# Patient Record
Sex: Female | Born: 1960 | Race: White | Hispanic: No | Marital: Married | Smoking: Current every day smoker
Health system: Southern US, Community
[De-identification: ages and names within clinical notes are randomized; demographics above are authoritative.]

## PROBLEM LIST (undated history)

## (undated) DIAGNOSIS — M1990 Unspecified osteoarthritis, unspecified site: Secondary | ICD-10-CM

## (undated) DIAGNOSIS — E079 Disorder of thyroid, unspecified: Secondary | ICD-10-CM

## (undated) DIAGNOSIS — E0590 Thyrotoxicosis, unspecified without thyrotoxic crisis or storm: Secondary | ICD-10-CM

## (undated) DIAGNOSIS — I700 Atherosclerosis of aorta: Secondary | ICD-10-CM

## (undated) DIAGNOSIS — M797 Fibromyalgia: Secondary | ICD-10-CM

## (undated) DIAGNOSIS — K219 Gastro-esophageal reflux disease without esophagitis: Secondary | ICD-10-CM

## (undated) DIAGNOSIS — I7300 Raynaud's syndrome without gangrene: Secondary | ICD-10-CM

## (undated) HISTORY — PX: DILATION AND CURETTAGE OF UTERUS: SHX78

## (undated) HISTORY — DX: Atherosclerosis of aorta: I70.0

## (undated) HISTORY — DX: Gastro-esophageal reflux disease without esophagitis: K21.9

## (undated) HISTORY — PX: BREAST SURGERY: SHX581

## (undated) HISTORY — DX: Raynaud's syndrome without gangrene: I73.00

---

## 1997-04-21 ENCOUNTER — Other Ambulatory Visit: Admission: RE | Admit: 1997-04-21 | Discharge: 1997-04-21 | Payer: Self-pay | Admitting: *Deleted

## 1999-02-01 ENCOUNTER — Other Ambulatory Visit: Admission: RE | Admit: 1999-02-01 | Discharge: 1999-02-01 | Payer: Self-pay | Admitting: Gynecology

## 1999-12-10 ENCOUNTER — Ambulatory Visit (HOSPITAL_COMMUNITY): Admission: RE | Admit: 1999-12-10 | Discharge: 1999-12-10 | Payer: Self-pay | Admitting: Gastroenterology

## 2002-10-28 ENCOUNTER — Other Ambulatory Visit: Admission: RE | Admit: 2002-10-28 | Discharge: 2002-10-28 | Payer: Self-pay | Admitting: Gynecology

## 2004-04-05 ENCOUNTER — Other Ambulatory Visit: Admission: RE | Admit: 2004-04-05 | Discharge: 2004-04-05 | Payer: Self-pay | Admitting: Gynecology

## 2005-05-09 ENCOUNTER — Other Ambulatory Visit: Admission: RE | Admit: 2005-05-09 | Discharge: 2005-05-09 | Payer: Self-pay | Admitting: Gynecology

## 2007-04-02 ENCOUNTER — Other Ambulatory Visit: Admission: RE | Admit: 2007-04-02 | Discharge: 2007-04-02 | Payer: Self-pay | Admitting: Gynecology

## 2007-09-09 ENCOUNTER — Emergency Department (HOSPITAL_COMMUNITY): Admission: EM | Admit: 2007-09-09 | Discharge: 2007-09-09 | Payer: Self-pay | Admitting: Emergency Medicine

## 2014-01-25 ENCOUNTER — Encounter (HOSPITAL_BASED_OUTPATIENT_CLINIC_OR_DEPARTMENT_OTHER): Payer: Self-pay | Admitting: Emergency Medicine

## 2014-01-25 ENCOUNTER — Emergency Department (HOSPITAL_BASED_OUTPATIENT_CLINIC_OR_DEPARTMENT_OTHER)
Admission: EM | Admit: 2014-01-25 | Discharge: 2014-01-25 | Disposition: A | Discharge status: Home / Self Care | Payer: No Typology Code available for payment source | Attending: Emergency Medicine | Admitting: Emergency Medicine

## 2014-01-25 ENCOUNTER — Emergency Department (HOSPITAL_BASED_OUTPATIENT_CLINIC_OR_DEPARTMENT_OTHER): Payer: No Typology Code available for payment source

## 2014-01-25 DIAGNOSIS — R1013 Epigastric pain: Secondary | ICD-10-CM | POA: Diagnosis not present

## 2014-01-25 DIAGNOSIS — R1011 Right upper quadrant pain: Secondary | ICD-10-CM | POA: Diagnosis not present

## 2014-01-25 DIAGNOSIS — Z8639 Personal history of other endocrine, nutritional and metabolic disease: Secondary | ICD-10-CM | POA: Diagnosis not present

## 2014-01-25 DIAGNOSIS — Z720 Tobacco use: Secondary | ICD-10-CM | POA: Insufficient documentation

## 2014-01-25 DIAGNOSIS — Z880 Allergy status to penicillin: Secondary | ICD-10-CM | POA: Diagnosis not present

## 2014-01-25 DIAGNOSIS — M7989 Other specified soft tissue disorders: Secondary | ICD-10-CM | POA: Diagnosis not present

## 2014-01-25 DIAGNOSIS — R079 Chest pain, unspecified: Secondary | ICD-10-CM | POA: Diagnosis present

## 2014-01-25 HISTORY — DX: Disorder of thyroid, unspecified: E07.9

## 2014-01-25 HISTORY — DX: Thyrotoxicosis, unspecified without thyrotoxic crisis or storm: E05.90

## 2014-01-25 HISTORY — DX: Fibromyalgia: M79.7

## 2014-01-25 HISTORY — DX: Unspecified osteoarthritis, unspecified site: M19.90

## 2014-01-25 LAB — CBC WITH DIFFERENTIAL/PLATELET
Basophils Absolute: 0.1 10*3/uL (ref 0.0–0.1)
Basophils Relative: 1 % (ref 0–1)
Eosinophils Absolute: 0.2 10*3/uL (ref 0.0–0.7)
Eosinophils Relative: 2 % (ref 0–5)
HCT: 40.4 % (ref 36.0–46.0)
Hemoglobin: 13.9 g/dL (ref 12.0–15.0)
LYMPHS ABS: 3.3 10*3/uL (ref 0.7–4.0)
LYMPHS PCT: 32 % (ref 12–46)
MCH: 31.4 pg (ref 26.0–34.0)
MCHC: 34.4 g/dL (ref 30.0–36.0)
MCV: 91.2 fL (ref 78.0–100.0)
Monocytes Absolute: 0.8 10*3/uL (ref 0.1–1.0)
Monocytes Relative: 7 % (ref 3–12)
NEUTROS PCT: 58 % (ref 43–77)
Neutro Abs: 5.8 10*3/uL (ref 1.7–7.7)
PLATELETS: 266 10*3/uL (ref 150–400)
RBC: 4.43 MIL/uL (ref 3.87–5.11)
RDW: 12.3 % (ref 11.5–15.5)
WBC: 10.1 10*3/uL (ref 4.0–10.5)

## 2014-01-25 LAB — COMPREHENSIVE METABOLIC PANEL
ALT: 35 U/L (ref 0–35)
AST: 28 U/L (ref 0–37)
Albumin: 4.5 g/dL (ref 3.5–5.2)
Alkaline Phosphatase: 103 U/L (ref 39–117)
Anion gap: 8 (ref 5–15)
BUN: 14 mg/dL (ref 6–23)
CALCIUM: 9.7 mg/dL (ref 8.4–10.5)
CO2: 28 mmol/L (ref 19–32)
CREATININE: 0.64 mg/dL (ref 0.50–1.10)
Chloride: 105 mEq/L (ref 96–112)
GLUCOSE: 95 mg/dL (ref 70–99)
Potassium: 4.6 mmol/L (ref 3.5–5.1)
Sodium: 141 mmol/L (ref 135–145)
TOTAL PROTEIN: 7.6 g/dL (ref 6.0–8.3)
Total Bilirubin: 0.4 mg/dL (ref 0.3–1.2)

## 2014-01-25 LAB — TROPONIN I

## 2014-01-25 LAB — D-DIMER, QUANTITATIVE (NOT AT ARMC)

## 2014-01-25 LAB — LIPASE, BLOOD: LIPASE: 43 U/L (ref 11–59)

## 2014-01-25 NOTE — ED Notes (Addendum)
PT presents to ED with complaints of RUQ pain around to her back and up her right arm to elbow and right side of neck and right jaw.  lasting about 3-4 minutes around 8 am.  PT was taking meds for hypothyroid and stopped taking it before christmas then started back yesterday.

## 2014-01-25 NOTE — Discharge Instructions (Signed)
The cause of your pain was not identified today. Please continue current medications as prescribed. Please follow-up with your family doctor and gastroenterologist for further testing.   Abdominal Pain, Women Abdominal (stomach, pelvic, or belly) pain can be caused by many things. It is important to tell your doctor:  The location of the pain.  Does it come and go or is it present all the time?  Are there things that start the pain (eating certain foods, exercise)?  Are there other symptoms associated with the pain (fever, nausea, vomiting, diarrhea)? All of this is helpful to know when trying to find the cause of the pain. CAUSES   Stomach: virus or bacteria infection, or ulcer.  Intestine: appendicitis (inflamed appendix), regional ileitis (Crohn's disease), ulcerative colitis (inflamed colon), irritable bowel syndrome, diverticulitis (inflamed diverticulum of the colon), or cancer of the stomach or intestine.  Gallbladder disease or stones in the gallbladder.  Kidney disease, kidney stones, or infection.  Pancreas infection or cancer.  Fibromyalgia (pain disorder).  Diseases of the female organs:  Uterus: fibroid (non-cancerous) tumors or infection.  Fallopian tubes: infection or tubal pregnancy.  Ovary: cysts or tumors.  Pelvic adhesions (scar tissue).  Endometriosis (uterus lining tissue growing in the pelvis and on the pelvic organs).  Pelvic congestion syndrome (female organs filling up with blood just before the menstrual period).  Pain with the menstrual period.  Pain with ovulation (producing an egg).  Pain with an IUD (intrauterine device, birth control) in the uterus.  Cancer of the female organs.  Functional pain (pain not caused by a disease, may improve without treatment).  Psychological pain.  Depression. DIAGNOSIS  Your doctor will decide the seriousness of your pain by doing an examination.  Blood tests.  X-rays.  Ultrasound.  CT scan  (computed tomography, special type of X-ray).  MRI (magnetic resonance imaging).  Cultures, for infection.  Barium enema (dye inserted in the large intestine, to better view it with X-rays).  Colonoscopy (looking in intestine with a lighted tube).  Laparoscopy (minor surgery, looking in abdomen with a lighted tube).  Major abdominal exploratory surgery (looking in abdomen with a large incision). TREATMENT  The treatment will depend on the cause of the pain.   Many cases can be observed and treated at home.  Over-the-counter medicines recommended by your caregiver.  Prescription medicine.  Antibiotics, for infection.  Birth control pills, for painful periods or for ovulation pain.  Hormone treatment, for endometriosis.  Nerve blocking injections.  Physical therapy.  Antidepressants.  Counseling with a psychologist or psychiatrist.  Minor or major surgery. HOME CARE INSTRUCTIONS   Do not take laxatives, unless directed by your caregiver.  Take over-the-counter pain medicine only if ordered by your caregiver. Do not take aspirin because it can cause an upset stomach or bleeding.  Try a clear liquid diet (broth or water) as ordered by your caregiver. Slowly move to a bland diet, as tolerated, if the pain is related to the stomach or intestine.  Have a thermometer and take your temperature several times a day, and record it.  Bed rest and sleep, if it helps the pain.  Avoid sexual intercourse, if it causes pain.  Avoid stressful situations.  Keep your follow-up appointments and tests, as your caregiver orders.  If the pain does not go away with medicine or surgery, you may try:  Acupuncture.  Relaxation exercises (yoga, meditation).  Group therapy.  Counseling. SEEK MEDICAL CARE IF:   You notice certain foods cause stomach  pain. °· Your home care treatment is not helping your pain. °· You need stronger pain medicine. °· You want your IUD removed. °· You  feel faint or lightheaded. °· You develop nausea and vomiting. °· You develop a rash. °· You are having side effects or an allergy to your medicine. °SEEK IMMEDIATE MEDICAL CARE IF:  °· Your pain does not go away or gets worse. °· You have a fever. °· Your pain is felt only in portions of the abdomen. The right side could possibly be appendicitis. The left lower portion of the abdomen could be colitis or diverticulitis. °· You are passing blood in your stools (bright red or black tarry stools, with or without vomiting). °· You have blood in your urine. °· You develop chills, with or without a fever. °· You pass out. °MAKE SURE YOU:  °· Understand these instructions. °· Will watch your condition. °· Will get help right away if you are not doing well or get worse. °Document Released: 11/07/2006 Document Revised: 05/27/2013 Document Reviewed: 11/27/2008 °Exit[employer]® Patient Information ©2015 [employer]. This information is not intended to replace advice given to you by your health care provider. Make sure you discuss any questions you have with your health care provider. ° °

## 2014-01-25 NOTE — ED Provider Notes (Signed)
CSN: [id number]     Arrival date & time 01/25/14  1451 History  This chart was scribed for Tilden Fossa, MD by Modena Jansky, ED Scribe. This patient was seen in room MH02/MH02 and the patient's care was started at 3:45 PM   Chief Complaint  Patient presents with  . Chest Pain   The history is provided by the patient. No language interpreter was used.   HPI Comments: Jennifer Parrish is a 54 y.o. female who presents to the Emergency Department complaining of constant moderate lower chest pain that started about 7 hours ago. She reports that this morning while she was taking a nap, she had a sudden onset of chest pain for about 3-4 minutes. She reports that the pain radiated to her back, then right shoulder, right elbow, neck, and then jaw. She reports that the pain stopped after 3-4 minutes, and never came back. She describes the pain as a dull pressure-like sensation with stabbing in her back. She states that there are no modifying factors for the pain. She reports that she has some hematuria that is usual, and some recent unusual leg swelling. She states that she has a hx of smoking, and lately has been under stress. She denies any fever, vomiting, diarrhea, or dysuria.   Past Medical History  Diagnosis Date  . Thyroid disease   . Hyperthyroidism   . Fibromyalgia   . Arthritis    History reviewed. No pertinent past surgical history. No family history on file. History  Substance Use Topics  . Smoking status: Current Every Day Smoker  . Smokeless tobacco: Not on file  . Alcohol Use: Not on file   OB History    No data available     Review of Systems  Constitutional: Negative for fever.  Cardiovascular: Positive for chest pain and leg swelling.  Gastrointestinal: Negative for vomiting and diarrhea.  Genitourinary: Positive for hematuria (ongoing issue). Negative for dysuria.  All other systems reviewed and are negative.   Allergies  Ciprofloxacin; Keflex; and Penicillins  Home  Medications   Prior to Admission medications   Not on File   BP 160/85 mmHg  Pulse 74  Temp(Src) 98.1 F (36.7 C) (Oral)  Resp 18  Ht  (1.626 m)  Wt 137 lb (62.143 kg)  BMI 23.50 kg/m2  SpO2 98% Physical Exam  Constitutional: She is oriented to person, place, and time. She appears well-developed and well-nourished.  HENT:  Head: Normocephalic and atraumatic.  Cardiovascular: Normal rate and regular rhythm.   No murmur heard. Pulmonary/Chest: Effort normal and breath sounds normal. No respiratory distress.  Abdominal: Soft. There is tenderness. There is no rebound and no guarding.  Mild epigastric TTP.   Musculoskeletal: She exhibits no edema or tenderness.  Neurological: She is alert and oriented to person, place, and time.  Skin: Skin is warm and dry.  Psychiatric: She has a normal mood and affect. Her behavior is normal.  Nursing note and vitals reviewed.   ED Course  Procedures (including critical care time) DIAGNOSTIC STUDIES: Oxygen Saturation is 98% on RA, normal by my interpretation.    COORDINATION OF CARE: 3:49 PM- Pt advised of plan for treatment which includes medication and labs and pt agrees.  Labs Review Labs Reviewed  COMPREHENSIVE METABOLIC PANEL  CBC WITH DIFFERENTIAL  TROPONIN I  D-DIMER, QUANTITATIVE  LIPASE, BLOOD    Imaging Review Dg Chest 2 View  01/25/2014   CLINICAL DATA:  Right chest pain radiating to the neck  and jaw starting this morning.  EXAM: CHEST  2 VIEW  COMPARISON:  None.  FINDINGS: The heart size and mediastinal contours are within normal limits. There is no focal infiltrate, pulmonary edema, or pleural effusion. The visualized skeletal structures are unremarkable.  IMPRESSION: No active cardiopulmonary disease.   Electronically Signed   By: Sherian Rein M.D.   On: 01/25/2014 16:19     EKG Interpretation   Date/Time:  Saturday January 25 2014 15:09:41 EST Ventricular Rate:  64 PR Interval:  152 QRS Duration: 84 QT  Interval:  422 QTC Calculation: 435 R Axis:   66 Text Interpretation:  Normal sinus rhythm Normal ECG Confirmed by Lincoln Brigham [phone]) on 01/25/2014 3:54:59 PM      MDM   Final diagnoses:  Right upper quadrant pain    Patient here for evaluation of right upper quadrant/right lower chest pain. History of presentation is not consistent with ACS, dissection. Patient is low risk for PE and d-dimer is negative, do not feel advanced imaging is warranted this time. History presentation is not consistent with acute cholecystitis or renal colic. Patient's pain was improved on recheck, discussed with patient PCP follow-up as well as return precautions.   I personally performed the services described in this documentation, which was scribed in my presence. The recorded information has been reviewed and is accurate.     Tilden Fossa, MD 01/25/14 1744

## 2014-04-08 ENCOUNTER — Other Ambulatory Visit (HOSPITAL_COMMUNITY): Payer: Self-pay | Admitting: Internal Medicine

## 2014-04-08 ENCOUNTER — Ambulatory Visit (HOSPITAL_COMMUNITY)
Admission: RE | Admit: 2014-04-08 | Discharge: 2014-04-08 | Disposition: A | Discharge status: Home / Self Care | Payer: Commercial Managed Care - HMO | Source: Ambulatory Visit | Attending: Internal Medicine | Admitting: Internal Medicine

## 2014-04-08 DIAGNOSIS — M25562 Pain in left knee: Secondary | ICD-10-CM

## 2014-04-08 DIAGNOSIS — M545 Low back pain: Secondary | ICD-10-CM | POA: Insufficient documentation

## 2014-04-08 DIAGNOSIS — M47896 Other spondylosis, lumbar region: Secondary | ICD-10-CM | POA: Diagnosis not present

## 2015-02-25 ENCOUNTER — Ambulatory Visit (INDEPENDENT_AMBULATORY_CARE_PROVIDER_SITE_OTHER): Payer: BLUE CROSS/BLUE SHIELD | Admitting: Physician Assistant

## 2015-02-25 VITALS — BP 120/82 | HR 80 | Temp 97.9°F | Resp 18 | Ht 63.25 in | Wt 133.6 lb

## 2015-02-25 DIAGNOSIS — A499 Bacterial infection, unspecified: Secondary | ICD-10-CM | POA: Diagnosis not present

## 2015-02-25 DIAGNOSIS — N760 Acute vaginitis: Secondary | ICD-10-CM | POA: Diagnosis not present

## 2015-02-25 DIAGNOSIS — R319 Hematuria, unspecified: Secondary | ICD-10-CM | POA: Diagnosis not present

## 2015-02-25 DIAGNOSIS — B9689 Other specified bacterial agents as the cause of diseases classified elsewhere: Secondary | ICD-10-CM

## 2015-02-25 LAB — POCT CBC
GRANULOCYTE PERCENT: 48.6 % (ref 37–80)
HEMATOCRIT: 40.9 % (ref 37.7–47.9)
Hemoglobin: 14.1 g/dL (ref 12.2–16.2)
Lymph, poc: 4 — AB (ref 0.6–3.4)
MCH, POC: 30.6 pg (ref 27–31.2)
MCHC: 34.4 g/dL (ref 31.8–35.4)
MCV: 88.8 fL (ref 80–97)
MID (CBC): 0.6 (ref 0–0.9)
MPV: 7.1 fL (ref 0–99.8)
PLATELET COUNT, POC: 266 10*3/uL (ref 142–424)
POC Granulocyte: 4.4 (ref 2–6.9)
POC LYMPH %: 44.4 % (ref 10–50)
POC MID %: 7 %M (ref 0–12)
RBC: 4.61 M/uL (ref 4.04–5.48)
RDW, POC: 12.7 %
WBC: 9.1 10*3/uL (ref 4.6–10.2)

## 2015-02-25 LAB — POCT URINALYSIS DIP (MANUAL ENTRY)
BILIRUBIN UA: NEGATIVE
Bilirubin, UA: NEGATIVE
Glucose, UA: NEGATIVE
Nitrite, UA: NEGATIVE
PH UA: 6.5
PROTEIN UA: NEGATIVE
Urobilinogen, UA: 0.2

## 2015-02-25 LAB — POC MICROSCOPIC URINALYSIS (UMFC): Mucus: ABSENT

## 2015-02-25 LAB — POCT WET + KOH PREP
TRICH BY WET PREP: ABSENT
YEAST BY KOH: ABSENT
YEAST BY WET PREP: ABSENT

## 2015-02-25 MED ORDER — METRONIDAZOLE 0.75 % VA GEL: 1.0000 | Freq: Two times a day (BID) | VAGINAL | Status: DC

## 2015-02-25 MED ORDER — SULFAMETHOXAZOLE-TRIMETHOPRIM 800-160 MG PO TABS: 1.0000 | ORAL_TABLET | Freq: Two times a day (BID) | ORAL | Status: DC

## 2015-02-25 NOTE — Progress Notes (Signed)
02/26/2015 6:26 PM   DOB: [birth year] / MRN: [id number]  SUBJECTIVE:  Jennifer Parrish is a 55 y.o. female presenting for passing blood in her urine.  Reports that she has been passing "clots" through her urine.  This started yesterday and reports it tends to occur near the end of voiding.  Also complains of dysuria, frequency and urgency.  Denies a history of kidney stone.  Does not think the blood is coming from her vagina.  She has tried drinking large volumes of water and this has helped her symptoms greatly and she is feeling normal right now.  She has multiple drug allergies.  Does report having a sexual encounter with her husband on Saturday in which her "uterus got in the way" and she had to reach down and manually adjust her anatomy.   She is allergic to ciprofloxacin; keflex; and penicillins.   She  has a past medical history of Thyroid disease; Hyperthyroidism; Fibromyalgia; Arthritis; and Fibromyalgia.    She  reports that she has been smoking.  She does not have any smokeless tobacco history on file. She  has no sexual activity history on file. The patient  has past surgical history that includes Breast surgery.  Her family history includes Cancer in her father; Diabetes in her maternal grandfather and paternal grandmother; Heart disease in her mother.  Review of Systems  Constitutional: Negative for fever and chills.  Eyes: Negative for blurred vision.  Respiratory: Negative for cough and shortness of breath.   Cardiovascular: Negative for chest pain.  Gastrointestinal: Negative for nausea and abdominal pain.  Musculoskeletal: Negative for myalgias.  Skin: Negative for rash.  Neurological: Negative for dizziness, tingling and headaches.  Psychiatric/Behavioral: Negative for depression. The patient is not nervous/anxious.     Problem list and medications reviewed and updated by myself where necessary, and exist elsewhere in the encounter.   OBJECTIVE:  BP 120/82 mmHg  Pulse 80   Temp(Src) 97.9 F (36.6 C) (Oral)  Resp 18  Ht 5' 3.25" (1.607 m)  Wt 133 lb 9.6 oz (60.601 kg)  BMI 23.47 kg/m2  SpO2 97%  Physical Exam  Constitutional: She is oriented to person, place, and time. She appears well-nourished. No distress.  Eyes: EOM are normal. Pupils are equal, round, and reactive to light.  Cardiovascular: Normal rate.   Pulmonary/Chest: Effort normal.  Abdominal: Soft. Bowel sounds are normal. She exhibits no distension and no mass. There is no tenderness. There is no rebound and no guarding.  Genitourinary: There is no rash or tenderness on the right labia. There is no rash or tenderness on the left labia. Uterus is not tender. Cervix exhibits no motion tenderness, no discharge and no friability. Right adnexum displays no mass, no tenderness and no fullness. Left adnexum displays no mass, no tenderness and no fullness. No erythema, tenderness or bleeding in the vagina. No foreign body around the vagina. No signs of injury around the vagina. No vaginal discharge found.  Neurological: She is alert and oriented to person, place, and time. No cranial nerve deficit. Gait normal.  Skin: Skin is dry. She is not diaphoretic.  Psychiatric: She has a normal mood and affect.  Vitals reviewed.   Results for orders placed or performed in visit on 02/25/15 (from the past 72 hour(s))  POCT urinalysis dipstick     Status: Abnormal   Collection Time: 02/25/15  6:55 PM  Result Value Ref Range   Color, UA yellow yellow   Clarity, UA cloudy (  A) clear   Glucose, UA negative negative   Bilirubin, UA negative negative   Ketones, POC UA negative negative   Spec Grav, UA <=1.005    Blood, UA moderate (A) negative   pH, UA 6.5    Protein Ur, POC negative negative   Urobilinogen, UA 0.2    Nitrite, UA Negative Negative   Leukocytes, UA Trace (A) Negative  POCT Microscopic Urinalysis ([facility])     Status: Abnormal   Collection Time: 02/25/15  6:55 PM  Result Value Ref Range    WBC,UR,HPF,POC Few (A) None WBC/hpf   RBC,UR,HPF,POC Few (A) None RBC/hpf   Bacteria None None, Too numerous to count   Mucus Absent Absent   Epithelial Cells, UR Per Microscopy Few (A) None, Too numerous to count cells/hpf  COMPLETE METABOLIC PANEL WITH GFR     Status: None   Collection Time: 02/25/15  7:26 PM  Result Value Ref Range   Sodium 138 135 - 146 mmol/L   Potassium 4.0 3.5 - 5.3 mmol/L   Chloride 104 98 - 110 mmol/L   CO2 26 20 - 31 mmol/L   Glucose, Bld 87 65 - 99 mg/dL   BUN 12 7 - 25 mg/dL   Creat 0.75 0.50 - 1.05 mg/dL   Total Bilirubin 0.3 0.2 - 1.2 mg/dL   Alkaline Phosphatase 103 33 - 130 U/L   AST 23 10 - 35 U/L   ALT 25 6 - 29 U/L   Total Protein 7.6 6.1 - 8.1 g/dL   Albumin 4.8 3.6 - 5.1 g/dL   Calcium 10.0 8.6 - 10.4 mg/dL   GFR, Est African American >89 >=60 mL/min   GFR, Est Non African American 89 >=60 mL/min    Comment:   The estimated GFR is a calculation valid for adults (>=33 years old) that uses the CKD-EPI algorithm to adjust for age and sex. It is   not to be used for children, pregnant women, hospitalized patients,    patients on dialysis, or with rapidly changing kidney function. According to the NKDEP, eGFR >89 is normal, 60-89 shows mild impairment, 30-59 shows moderate impairment, 15-29 shows severe impairment and <15 is ESRD.     POCT Wet + KOH Prep     Status: Abnormal   Collection Time: 02/25/15  7:41 PM  Result Value Ref Range   Yeast by KOH Absent Present, Absent   Yeast by wet prep Absent Present, Absent   WBC by wet prep Too numerous to count  None, Few, Too numerous to count   Clue Cells Wet Prep HPF POC Few (A) None, Too numerous to count   Trich by wet prep Absent Present, Absent   Bacteria Wet Prep HPF POC Few None, Few, Too numerous to count   Epithelial Cells By [employer] Pref ([facility]) Moderate (A) None, Few, Too numerous to count   RBC,UR,HPF,POC None None RBC/hpf  POCT CBC     Status: Abnormal   Collection Time: 02/25/15  7:41  PM  Result Value Ref Range   WBC 9.1 4.6 - 10.2 K/uL   Lymph, poc 4.0 (A) 0.6 - 3.4   POC LYMPH PERCENT 44.4 10 - 50 %L   MID (cbc) 0.6 0 - 0.9   POC MID % 7.0 0 - 12 %M   POC Granulocyte 4.4 2 - 6.9   Granulocyte percent 48.6 37 - 80 %G   RBC 4.61 4.04 - 5.48 M/uL   Hemoglobin 14.1 12.2 - 16.2 g/dL   HCT,  POC 40.9 37.7 - 47.9 %   MCV 88.8 80 - 97 fL   MCH, POC 30.6 27 - 31.2 pg   MCHC 34.4 31.8 - 35.4 g/dL   RDW, POC 12.7 %   Platelet Count, POC 266 142 - 424 K/uL   MPV 7.1 0 - 99.8 fL    No results found.  ASSESSMENT AND PLAN  Deslyn was seen today for urinary tract infection.  Diagnoses and all orders for this visit:  Blood in urine: She has an interesting presentation.  Per her HPI the clots are likely secondary to mild trauma during intercourse.  CBC and UA don't support pyelo, however given her report of preceding back pain will cover for that with a secondary option given her allergies.  She does have BV although this does not explain her symptoms.  Given the relief she has had with po fluids and ibuprofen she may have passed a stone.  Will call her tomorrow to see how she is doing and will consider adding flomax if she remains symptomatic.   -     POCT urinalysis dipstick -     POCT Microscopic Urinalysis ([facility]) -     Urine culture -     POCT Wet + KOH Prep -     POCT CBC -     COMPLETE METABOLIC PANEL WITH GFR -     sulfamethoxazole-trimethoprim (BACTRIM DS,SEPTRA DS) 800-160 MG tablet; Take 1 tablet by mouth 2 (two) times daily. -     Cancel: GC probe amplification, urine -     GC/Chlamydia Probe Amp  BV (bacterial vaginosis) -     metroNIDAZOLE (METROGEL VAGINAL) 0.75 % vaginal gel; Place 1 Applicatorful vaginally 2 (two) times daily.    The patient was advised to call or return to clinic if she does not see an improvement in symptoms or to seek the care of the closest emergency department if she worsens with the above plan.   Philis Fendt, MHS, PA-C [facility] 02/26/2015 6:26 PM

## 2015-02-26 LAB — COMPLETE METABOLIC PANEL WITH GFR
ALBUMIN: 4.8 g/dL (ref 3.6–5.1)
ALK PHOS: 103 U/L (ref 33–130)
ALT: 25 U/L (ref 6–29)
AST: 23 U/L (ref 10–35)
BILIRUBIN TOTAL: 0.3 mg/dL (ref 0.2–1.2)
BUN: 12 mg/dL (ref 7–25)
CALCIUM: 10 mg/dL (ref 8.6–10.4)
CO2: 26 mmol/L (ref 20–31)
Chloride: 104 mmol/L (ref 98–110)
Creat: 0.75 mg/dL (ref 0.50–1.05)
GFR, Est African American: >89 mL/min (ref >=60)
GFR, Est Non African American: 89 mL/min (ref >=60)
Glucose, Bld: 87 mg/dL (ref 65–99)
POTASSIUM: 4 mmol/L (ref 3.5–5.3)
Sodium: 138 mmol/L (ref 135–146)
TOTAL PROTEIN: 7.6 g/dL (ref 6.1–8.1)

## 2015-02-27 LAB — GC/CHLAMYDIA PROBE AMP
CT Probe RNA: NOT DETECTED
GC Probe RNA: NOT DETECTED

## 2015-02-28 LAB — URINE CULTURE: Colony Count: >=100000

## 2015-03-11 ENCOUNTER — Encounter: Payer: Self-pay | Admitting: Physician Assistant

## 2015-05-18 DIAGNOSIS — Z1231 Encounter for screening mammogram for malignant neoplasm of breast: Secondary | ICD-10-CM | POA: Diagnosis not present

## 2015-05-18 DIAGNOSIS — Z803 Family history of malignant neoplasm of breast: Secondary | ICD-10-CM | POA: Diagnosis not present

## 2015-06-08 DIAGNOSIS — K5730 Diverticulosis of large intestine without perforation or abscess without bleeding: Secondary | ICD-10-CM | POA: Diagnosis not present

## 2015-06-08 DIAGNOSIS — Z1211 Encounter for screening for malignant neoplasm of colon: Secondary | ICD-10-CM | POA: Diagnosis not present

## 2015-06-15 DIAGNOSIS — H524 Presbyopia: Secondary | ICD-10-CM | POA: Diagnosis not present

## 2015-10-12 DIAGNOSIS — Z23 Encounter for immunization: Secondary | ICD-10-CM | POA: Diagnosis not present

## 2015-10-12 DIAGNOSIS — L658 Other specified nonscarring hair loss: Secondary | ICD-10-CM | POA: Diagnosis not present

## 2015-10-12 DIAGNOSIS — L659 Nonscarring hair loss, unspecified: Secondary | ICD-10-CM | POA: Diagnosis not present

## 2015-10-12 DIAGNOSIS — E038 Other specified hypothyroidism: Secondary | ICD-10-CM | POA: Diagnosis not present

## 2015-10-12 DIAGNOSIS — M2550 Pain in unspecified joint: Secondary | ICD-10-CM | POA: Diagnosis not present

## 2015-11-09 DIAGNOSIS — M79641 Pain in right hand: Secondary | ICD-10-CM | POA: Diagnosis not present

## 2015-11-09 DIAGNOSIS — M1811 Unilateral primary osteoarthritis of first carpometacarpal joint, right hand: Secondary | ICD-10-CM | POA: Diagnosis not present

## 2015-11-09 DIAGNOSIS — M180 Bilateral primary osteoarthritis of first carpometacarpal joints: Secondary | ICD-10-CM | POA: Diagnosis not present

## 2015-11-09 DIAGNOSIS — M1812 Unilateral primary osteoarthritis of first carpometacarpal joint, left hand: Secondary | ICD-10-CM | POA: Diagnosis not present

## 2016-05-09 DIAGNOSIS — E559 Vitamin D deficiency, unspecified: Secondary | ICD-10-CM | POA: Diagnosis not present

## 2016-05-09 DIAGNOSIS — R3129 Other microscopic hematuria: Secondary | ICD-10-CM | POA: Diagnosis not present

## 2016-05-09 DIAGNOSIS — E784 Other hyperlipidemia: Secondary | ICD-10-CM | POA: Diagnosis not present

## 2016-05-09 DIAGNOSIS — E038 Other specified hypothyroidism: Secondary | ICD-10-CM | POA: Diagnosis not present

## 2016-05-16 DIAGNOSIS — Z0000 Encounter for general adult medical examination without abnormal findings: Secondary | ICD-10-CM | POA: Diagnosis not present

## 2016-05-16 DIAGNOSIS — Z23 Encounter for immunization: Secondary | ICD-10-CM | POA: Diagnosis not present

## 2016-05-16 DIAGNOSIS — E784 Other hyperlipidemia: Secondary | ICD-10-CM | POA: Diagnosis not present

## 2016-05-16 DIAGNOSIS — R0789 Other chest pain: Secondary | ICD-10-CM | POA: Diagnosis not present

## 2016-05-16 DIAGNOSIS — R1084 Generalized abdominal pain: Secondary | ICD-10-CM | POA: Diagnosis not present

## 2016-05-16 DIAGNOSIS — Z6825 Body mass index (BMI) 25.0-25.9, adult: Secondary | ICD-10-CM | POA: Diagnosis not present

## 2016-05-16 DIAGNOSIS — I700 Atherosclerosis of aorta: Secondary | ICD-10-CM | POA: Diagnosis not present

## 2016-05-16 DIAGNOSIS — Z1389 Encounter for screening for other disorder: Secondary | ICD-10-CM | POA: Diagnosis not present

## 2016-10-10 DIAGNOSIS — Z1231 Encounter for screening mammogram for malignant neoplasm of breast: Secondary | ICD-10-CM | POA: Diagnosis not present

## 2016-11-14 DIAGNOSIS — M797 Fibromyalgia: Secondary | ICD-10-CM | POA: Diagnosis not present

## 2016-11-14 DIAGNOSIS — E038 Other specified hypothyroidism: Secondary | ICD-10-CM | POA: Diagnosis not present

## 2016-11-14 DIAGNOSIS — F3289 Other specified depressive episodes: Secondary | ICD-10-CM | POA: Diagnosis not present

## 2016-11-14 DIAGNOSIS — Z23 Encounter for immunization: Secondary | ICD-10-CM | POA: Diagnosis not present

## 2016-11-14 DIAGNOSIS — M545 Low back pain: Secondary | ICD-10-CM | POA: Diagnosis not present

## 2016-11-15 DIAGNOSIS — Z23 Encounter for immunization: Secondary | ICD-10-CM | POA: Diagnosis not present

## 2016-11-15 DIAGNOSIS — M545 Low back pain: Secondary | ICD-10-CM | POA: Diagnosis not present

## 2017-04-17 DIAGNOSIS — H524 Presbyopia: Secondary | ICD-10-CM | POA: Diagnosis not present

## 2017-04-17 DIAGNOSIS — H5203 Hypermetropia, bilateral: Secondary | ICD-10-CM | POA: Diagnosis not present

## 2017-06-20 DIAGNOSIS — E559 Vitamin D deficiency, unspecified: Secondary | ICD-10-CM | POA: Diagnosis not present

## 2017-06-20 DIAGNOSIS — R82998 Other abnormal findings in urine: Secondary | ICD-10-CM | POA: Diagnosis not present

## 2017-06-20 DIAGNOSIS — E038 Other specified hypothyroidism: Secondary | ICD-10-CM | POA: Diagnosis not present

## 2017-06-20 DIAGNOSIS — Z0000 Encounter for general adult medical examination without abnormal findings: Secondary | ICD-10-CM | POA: Diagnosis not present

## 2017-06-26 DIAGNOSIS — Z1389 Encounter for screening for other disorder: Secondary | ICD-10-CM | POA: Diagnosis not present

## 2017-06-26 DIAGNOSIS — L658 Other specified nonscarring hair loss: Secondary | ICD-10-CM | POA: Diagnosis not present

## 2017-06-26 DIAGNOSIS — I700 Atherosclerosis of aorta: Secondary | ICD-10-CM | POA: Diagnosis not present

## 2017-06-26 DIAGNOSIS — Z0000 Encounter for general adult medical examination without abnormal findings: Secondary | ICD-10-CM | POA: Diagnosis not present

## 2017-06-26 DIAGNOSIS — E7849 Other hyperlipidemia: Secondary | ICD-10-CM | POA: Diagnosis not present

## 2017-06-26 DIAGNOSIS — M545 Low back pain: Secondary | ICD-10-CM | POA: Diagnosis not present

## 2017-06-27 ENCOUNTER — Other Ambulatory Visit: Payer: Self-pay | Admitting: Internal Medicine

## 2017-06-27 DIAGNOSIS — M545 Low back pain, unspecified: Secondary | ICD-10-CM

## 2017-08-28 DIAGNOSIS — E038 Other specified hypothyroidism: Secondary | ICD-10-CM | POA: Diagnosis not present

## 2017-11-20 DIAGNOSIS — Z1231 Encounter for screening mammogram for malignant neoplasm of breast: Secondary | ICD-10-CM | POA: Diagnosis not present

## 2017-11-20 DIAGNOSIS — Z803 Family history of malignant neoplasm of breast: Secondary | ICD-10-CM | POA: Diagnosis not present

## 2018-11-26 DIAGNOSIS — M62830 Muscle spasm of back: Secondary | ICD-10-CM | POA: Diagnosis not present

## 2018-11-26 DIAGNOSIS — M9903 Segmental and somatic dysfunction of lumbar region: Secondary | ICD-10-CM | POA: Diagnosis not present

## 2018-11-26 DIAGNOSIS — M545 Low back pain: Secondary | ICD-10-CM | POA: Diagnosis not present

## 2018-11-26 DIAGNOSIS — M9901 Segmental and somatic dysfunction of cervical region: Secondary | ICD-10-CM | POA: Diagnosis not present

## 2018-11-28 DIAGNOSIS — M9901 Segmental and somatic dysfunction of cervical region: Secondary | ICD-10-CM | POA: Diagnosis not present

## 2018-11-28 DIAGNOSIS — M62830 Muscle spasm of back: Secondary | ICD-10-CM | POA: Diagnosis not present

## 2018-11-28 DIAGNOSIS — M9903 Segmental and somatic dysfunction of lumbar region: Secondary | ICD-10-CM | POA: Diagnosis not present

## 2018-11-28 DIAGNOSIS — M545 Low back pain: Secondary | ICD-10-CM | POA: Diagnosis not present

## 2018-11-30 DIAGNOSIS — M9903 Segmental and somatic dysfunction of lumbar region: Secondary | ICD-10-CM | POA: Diagnosis not present

## 2018-11-30 DIAGNOSIS — M545 Low back pain: Secondary | ICD-10-CM | POA: Diagnosis not present

## 2018-11-30 DIAGNOSIS — M9901 Segmental and somatic dysfunction of cervical region: Secondary | ICD-10-CM | POA: Diagnosis not present

## 2018-11-30 DIAGNOSIS — M62830 Muscle spasm of back: Secondary | ICD-10-CM | POA: Diagnosis not present

## 2018-12-03 DIAGNOSIS — M9903 Segmental and somatic dysfunction of lumbar region: Secondary | ICD-10-CM | POA: Diagnosis not present

## 2018-12-03 DIAGNOSIS — M545 Low back pain: Secondary | ICD-10-CM | POA: Diagnosis not present

## 2018-12-03 DIAGNOSIS — M62830 Muscle spasm of back: Secondary | ICD-10-CM | POA: Diagnosis not present

## 2018-12-03 DIAGNOSIS — M9901 Segmental and somatic dysfunction of cervical region: Secondary | ICD-10-CM | POA: Diagnosis not present

## 2018-12-05 DIAGNOSIS — E039 Hypothyroidism, unspecified: Secondary | ICD-10-CM | POA: Diagnosis not present

## 2018-12-05 DIAGNOSIS — M9901 Segmental and somatic dysfunction of cervical region: Secondary | ICD-10-CM | POA: Diagnosis not present

## 2018-12-05 DIAGNOSIS — M9903 Segmental and somatic dysfunction of lumbar region: Secondary | ICD-10-CM | POA: Diagnosis not present

## 2018-12-05 DIAGNOSIS — M545 Low back pain: Secondary | ICD-10-CM | POA: Diagnosis not present

## 2018-12-05 DIAGNOSIS — F17210 Nicotine dependence, cigarettes, uncomplicated: Secondary | ICD-10-CM | POA: Diagnosis not present

## 2018-12-05 DIAGNOSIS — R0789 Other chest pain: Secondary | ICD-10-CM | POA: Diagnosis not present

## 2018-12-05 DIAGNOSIS — M62830 Muscle spasm of back: Secondary | ICD-10-CM | POA: Diagnosis not present

## 2018-12-07 DIAGNOSIS — M62830 Muscle spasm of back: Secondary | ICD-10-CM | POA: Diagnosis not present

## 2018-12-07 DIAGNOSIS — M9901 Segmental and somatic dysfunction of cervical region: Secondary | ICD-10-CM | POA: Diagnosis not present

## 2018-12-07 DIAGNOSIS — M545 Low back pain: Secondary | ICD-10-CM | POA: Diagnosis not present

## 2018-12-07 DIAGNOSIS — M9903 Segmental and somatic dysfunction of lumbar region: Secondary | ICD-10-CM | POA: Diagnosis not present

## 2018-12-10 DIAGNOSIS — M9901 Segmental and somatic dysfunction of cervical region: Secondary | ICD-10-CM | POA: Diagnosis not present

## 2018-12-10 DIAGNOSIS — M62830 Muscle spasm of back: Secondary | ICD-10-CM | POA: Diagnosis not present

## 2018-12-10 DIAGNOSIS — M9903 Segmental and somatic dysfunction of lumbar region: Secondary | ICD-10-CM | POA: Diagnosis not present

## 2018-12-10 DIAGNOSIS — M545 Low back pain: Secondary | ICD-10-CM | POA: Diagnosis not present

## 2018-12-12 DIAGNOSIS — M545 Low back pain: Secondary | ICD-10-CM | POA: Diagnosis not present

## 2018-12-12 DIAGNOSIS — M9901 Segmental and somatic dysfunction of cervical region: Secondary | ICD-10-CM | POA: Diagnosis not present

## 2018-12-12 DIAGNOSIS — M62830 Muscle spasm of back: Secondary | ICD-10-CM | POA: Diagnosis not present

## 2018-12-12 DIAGNOSIS — M9903 Segmental and somatic dysfunction of lumbar region: Secondary | ICD-10-CM | POA: Diagnosis not present

## 2018-12-14 DIAGNOSIS — M62830 Muscle spasm of back: Secondary | ICD-10-CM | POA: Diagnosis not present

## 2018-12-14 DIAGNOSIS — M9901 Segmental and somatic dysfunction of cervical region: Secondary | ICD-10-CM | POA: Diagnosis not present

## 2018-12-14 DIAGNOSIS — M9903 Segmental and somatic dysfunction of lumbar region: Secondary | ICD-10-CM | POA: Diagnosis not present

## 2018-12-14 DIAGNOSIS — M545 Low back pain: Secondary | ICD-10-CM | POA: Diagnosis not present

## 2018-12-14 DIAGNOSIS — M62838 Other muscle spasm: Secondary | ICD-10-CM | POA: Diagnosis not present

## 2018-12-17 DIAGNOSIS — M62830 Muscle spasm of back: Secondary | ICD-10-CM | POA: Diagnosis not present

## 2018-12-17 DIAGNOSIS — M9901 Segmental and somatic dysfunction of cervical region: Secondary | ICD-10-CM | POA: Diagnosis not present

## 2018-12-17 DIAGNOSIS — M9903 Segmental and somatic dysfunction of lumbar region: Secondary | ICD-10-CM | POA: Diagnosis not present

## 2018-12-17 DIAGNOSIS — M545 Low back pain: Secondary | ICD-10-CM | POA: Diagnosis not present

## 2018-12-31 ENCOUNTER — Other Ambulatory Visit: Payer: Self-pay

## 2018-12-31 DIAGNOSIS — Z20822 Contact with and (suspected) exposure to covid-19: Secondary | ICD-10-CM

## 2019-01-01 LAB — NOVEL CORONAVIRUS, NAA: SARS-CoV-2, NAA: NOT DETECTED

## 2019-02-04 DIAGNOSIS — R05 Cough: Secondary | ICD-10-CM | POA: Diagnosis not present

## 2019-02-04 DIAGNOSIS — F17210 Nicotine dependence, cigarettes, uncomplicated: Secondary | ICD-10-CM | POA: Diagnosis not present

## 2019-03-06 ENCOUNTER — Ambulatory Visit: Payer: No Typology Code available for payment source | Attending: Internal Medicine

## 2019-03-06 DIAGNOSIS — Z20822 Contact with and (suspected) exposure to covid-19: Secondary | ICD-10-CM

## 2019-03-07 LAB — NOVEL CORONAVIRUS, NAA: SARS-CoV-2, NAA: NOT DETECTED

## 2019-03-25 DIAGNOSIS — Z1231 Encounter for screening mammogram for malignant neoplasm of breast: Secondary | ICD-10-CM | POA: Diagnosis not present

## 2019-03-25 DIAGNOSIS — Z780 Asymptomatic menopausal state: Secondary | ICD-10-CM | POA: Diagnosis not present

## 2019-03-25 DIAGNOSIS — M8589 Other specified disorders of bone density and structure, multiple sites: Secondary | ICD-10-CM | POA: Diagnosis not present

## 2019-03-29 DIAGNOSIS — R922 Inconclusive mammogram: Secondary | ICD-10-CM | POA: Diagnosis not present

## 2019-03-29 DIAGNOSIS — N6489 Other specified disorders of breast: Secondary | ICD-10-CM | POA: Diagnosis not present

## 2019-05-29 DIAGNOSIS — M79644 Pain in right finger(s): Secondary | ICD-10-CM | POA: Diagnosis not present

## 2019-06-10 DIAGNOSIS — M79644 Pain in right finger(s): Secondary | ICD-10-CM | POA: Diagnosis not present

## 2019-08-26 DIAGNOSIS — E039 Hypothyroidism, unspecified: Secondary | ICD-10-CM | POA: Diagnosis not present

## 2019-08-26 DIAGNOSIS — E038 Other specified hypothyroidism: Secondary | ICD-10-CM | POA: Diagnosis not present

## 2019-10-07 DIAGNOSIS — R922 Inconclusive mammogram: Secondary | ICD-10-CM | POA: Diagnosis not present

## 2019-12-04 DIAGNOSIS — Z0000 Encounter for general adult medical examination without abnormal findings: Secondary | ICD-10-CM | POA: Diagnosis not present

## 2019-12-04 DIAGNOSIS — E039 Hypothyroidism, unspecified: Secondary | ICD-10-CM | POA: Diagnosis not present

## 2019-12-04 DIAGNOSIS — E559 Vitamin D deficiency, unspecified: Secondary | ICD-10-CM | POA: Diagnosis not present

## 2019-12-04 DIAGNOSIS — E785 Hyperlipidemia, unspecified: Secondary | ICD-10-CM | POA: Diagnosis not present

## 2019-12-11 DIAGNOSIS — E039 Hypothyroidism, unspecified: Secondary | ICD-10-CM | POA: Diagnosis not present

## 2019-12-11 DIAGNOSIS — R82998 Other abnormal findings in urine: Secondary | ICD-10-CM | POA: Diagnosis not present

## 2019-12-11 DIAGNOSIS — Z1331 Encounter for screening for depression: Secondary | ICD-10-CM | POA: Diagnosis not present

## 2019-12-11 DIAGNOSIS — Z0000 Encounter for general adult medical examination without abnormal findings: Secondary | ICD-10-CM | POA: Diagnosis not present

## 2019-12-13 ENCOUNTER — Other Ambulatory Visit: Payer: Self-pay | Admitting: Internal Medicine

## 2019-12-13 DIAGNOSIS — E785 Hyperlipidemia, unspecified: Secondary | ICD-10-CM

## 2020-01-13 ENCOUNTER — Ambulatory Visit: Payer: No Typology Code available for payment source | Admitting: Physician Assistant

## 2020-01-13 ENCOUNTER — Ambulatory Visit
Admission: RE | Admit: 2020-01-13 | Discharge: 2020-01-13 | Disposition: A | Discharge status: Home / Self Care | Payer: Self-pay | Source: Ambulatory Visit | Attending: Internal Medicine | Admitting: Internal Medicine

## 2020-01-13 DIAGNOSIS — E785 Hyperlipidemia, unspecified: Secondary | ICD-10-CM

## 2020-12-07 DIAGNOSIS — Z1231 Encounter for screening mammogram for malignant neoplasm of breast: Secondary | ICD-10-CM | POA: Diagnosis not present

## 2020-12-23 ENCOUNTER — Encounter: Payer: Self-pay | Admitting: Internal Medicine

## 2021-01-14 IMAGING — CT CT CARDIAC CORONARY ARTERY CALCIUM SCORE
3 series · 14 of 20 positions shown, 16 images · non-contrast
Comparison: None.

CLINICAL DATA: 59-year-old Caucasian female with elevated
cholesterol.

EXAM:
CT CARDIAC CORONARY ARTERY CALCIUM SCORE
TECHNIQUE: Non-contrast imaging through the heart was performed using
prospective ECG gating. Image post processing was performed on an
independent workstation, allowing for quantitative analysis of the
heart and coronary arteries. Note that this exam targets the heart
and the chest was not imaged in its entirety.

[Series 2: calcium scoring 2.00 qr36 bestdiast 68% hrt calciu · axial · 0.41mm/px · z∈[+1574,+1658]mm · 4 of 70 slices shown]
[im 14/70  vessel]
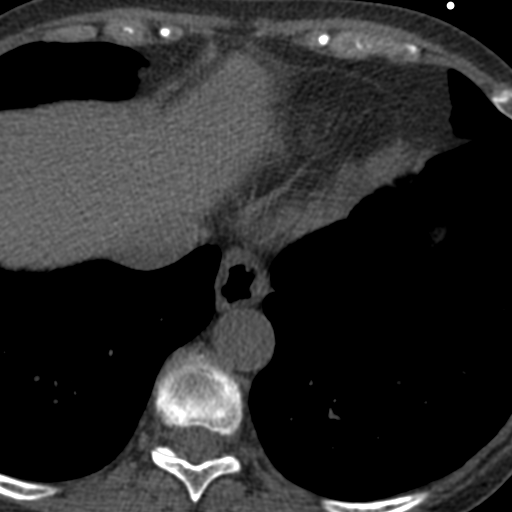
[im 28/70  vessel]
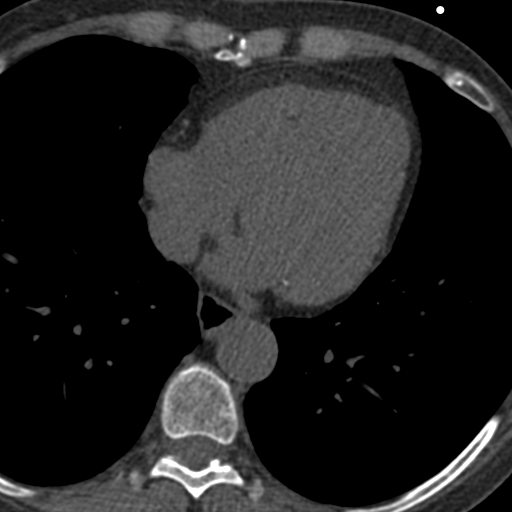
[im 42/70  vessel]
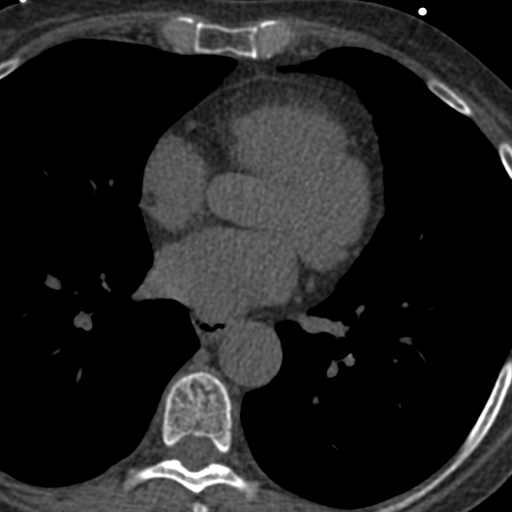
[im 56/70  vessel]
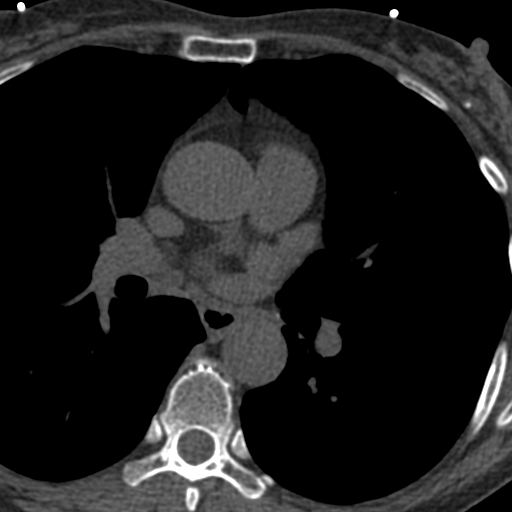

[Series 3: calcium scoring 2.00 br40 bestdiast 68% axial · axial · 0.53mm/px · z∈[+1570,+1662]mm · 5 of 70 slices shown, 7 images]
[im 12/70  vessel]
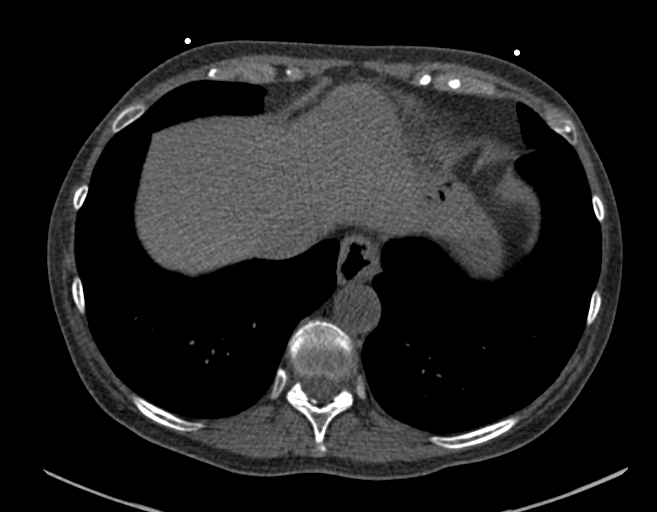
[im 12/70  lung]
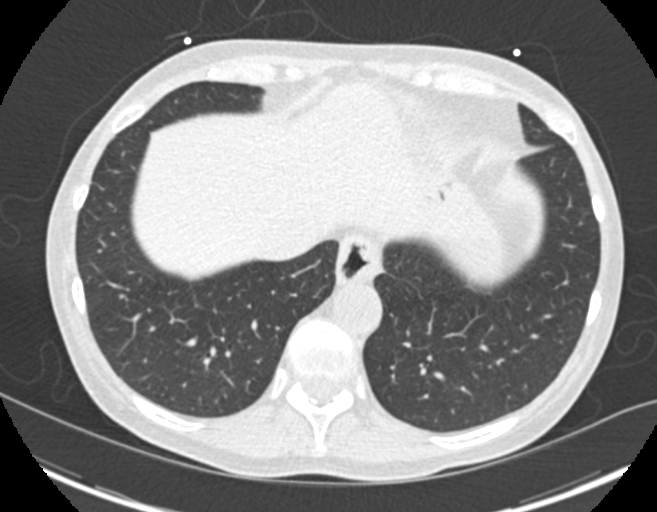
[im 24/70  vessel]
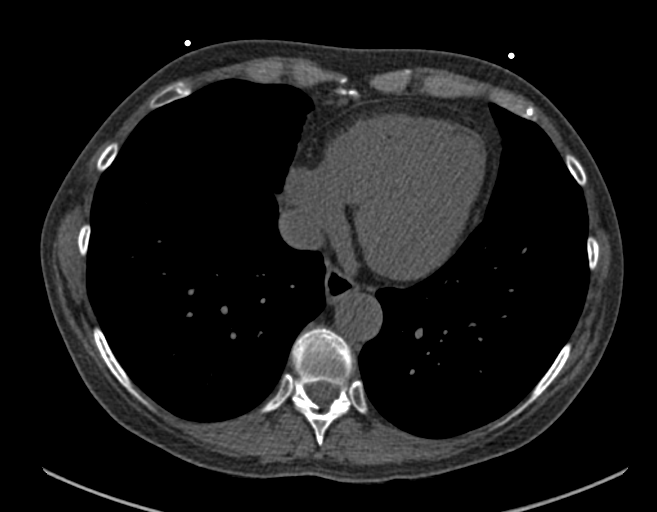
[im 35/70  vessel]
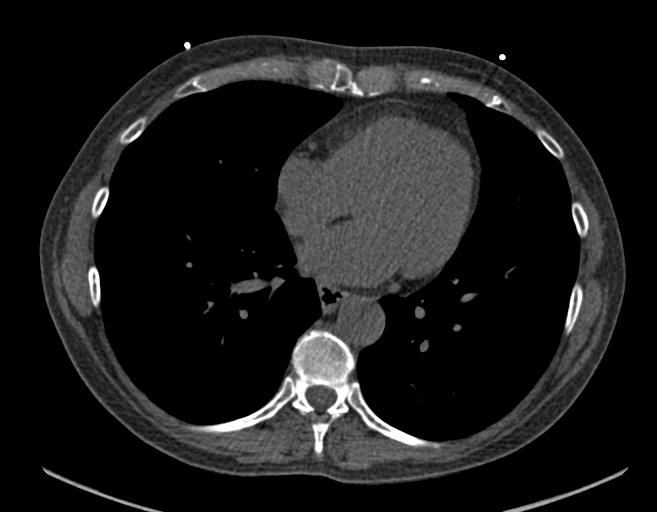
[im 47/70  vessel]
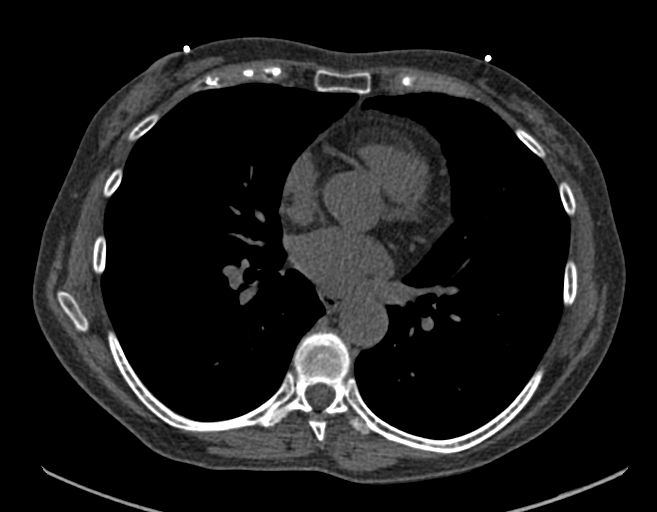
[im 58/70  vessel]
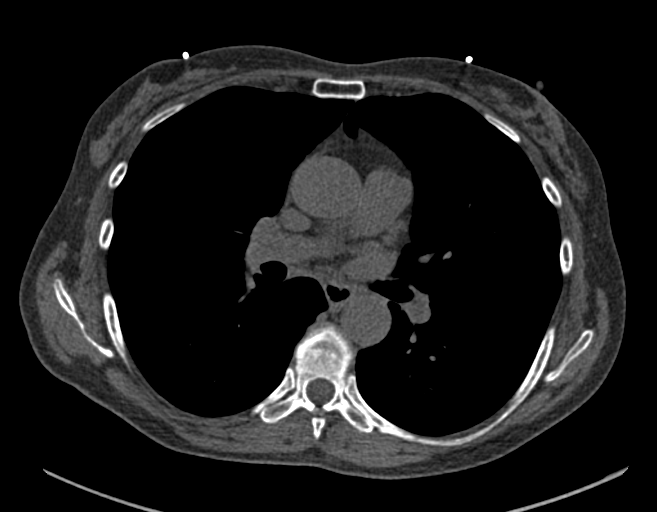
[im 58/70  lung]
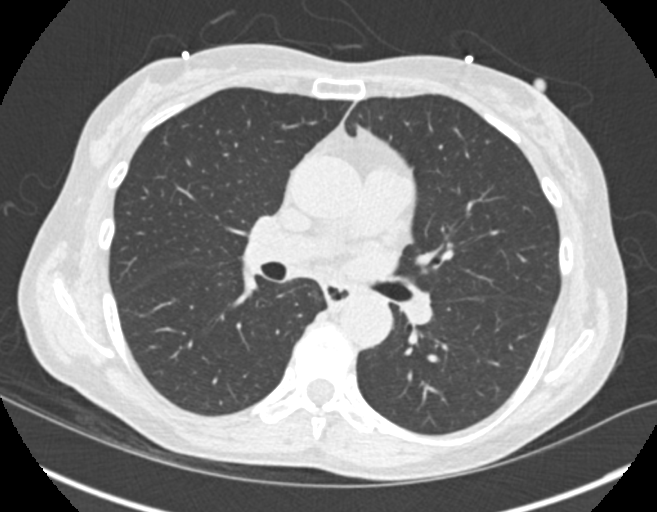

[Series 9: calcium scoring 2.00 br60 bestdiast 68% lungs · axial · 0.53mm/px · z∈[+1570,+1662]mm · 5 of 70 slices shown]
[im 12/70  vessel]
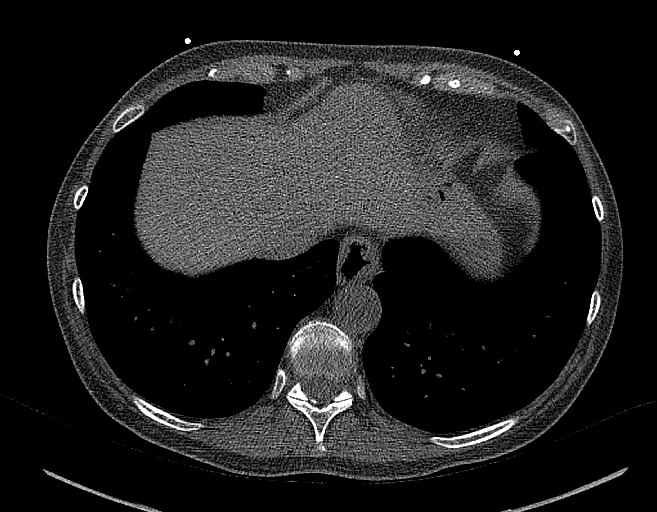
[im 24/70  vessel]
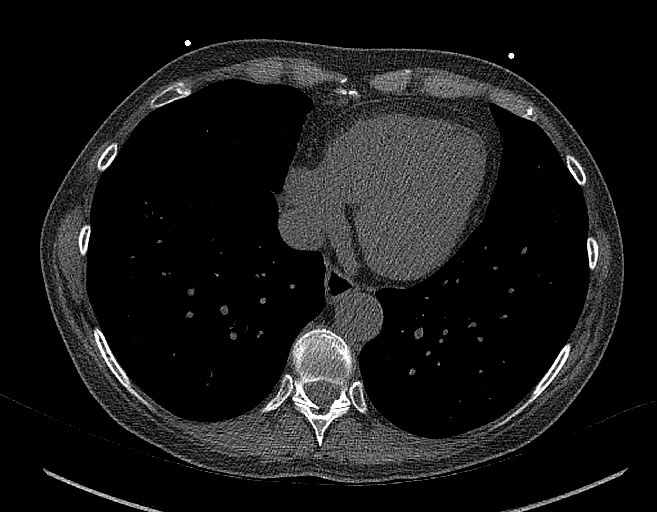
[im 35/70  vessel]
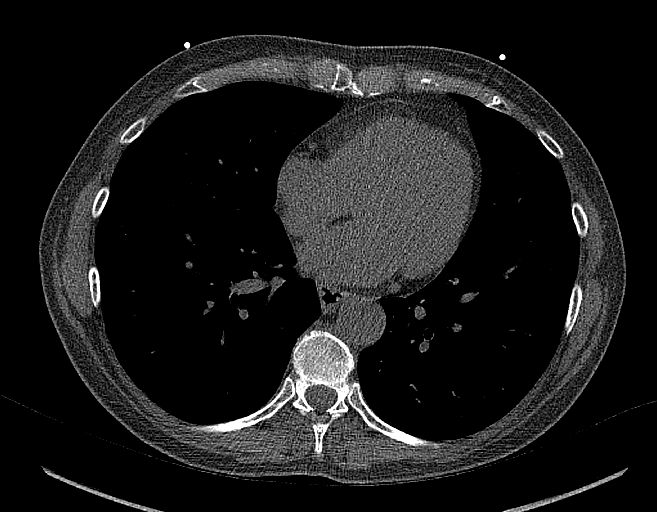
[im 47/70  vessel]
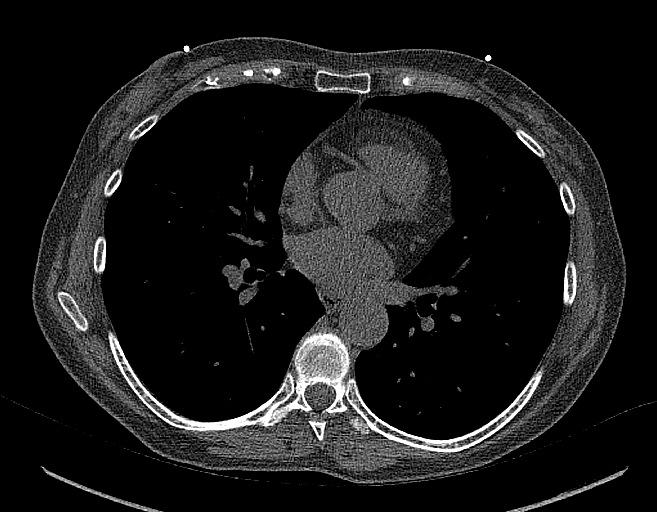
[im 58/70  vessel]
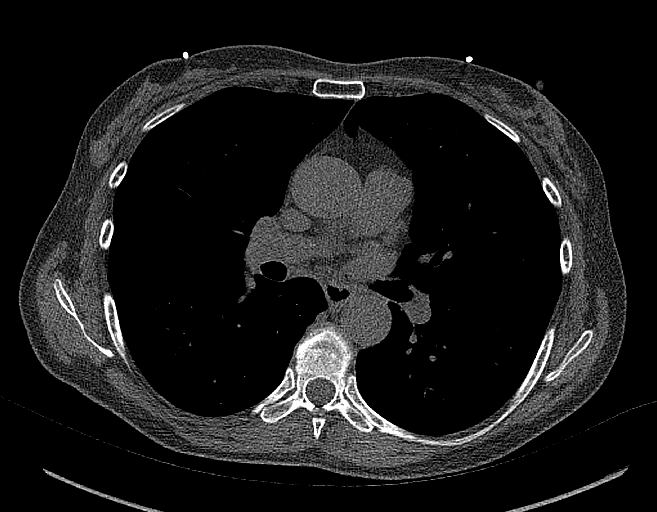

[14 of 20 positions shown; findings below may reference images not displayed]

FINDINGS: Technical quality: Good

CORONARY CALCIUM SCORES:

Left Main: No coronary artery calcification

LAD: No coronary calcification

LCx: No coronary calcification

RCA: No coronary calcification

CORONARY CALCIUM

Total Agatston Score: 0

[HOSPITAL] percentile: 0

Ascending aorta (normal <  40 mm): 33 mm

EXTRACARDIAC FINDINGS:

Limited view of the lung parenchyma demonstrates no suspicious
nodularity. Airways are normal.

Limited view of the mediastinum demonstrates no adenopathy.
Esophagus normal.

Limited view of the upper abdomen is unremarkable.

Limited view of the skeleton and chest wall is unremarkable.
IMPRESSION: 1. No coronary artery calcification.

2. Total Agatston Score: 0

3. MESA age and sex matched database percentile: 0

## 2021-01-26 DIAGNOSIS — E785 Hyperlipidemia, unspecified: Secondary | ICD-10-CM | POA: Diagnosis not present

## 2021-01-26 DIAGNOSIS — E039 Hypothyroidism, unspecified: Secondary | ICD-10-CM | POA: Diagnosis not present

## 2021-01-26 DIAGNOSIS — E559 Vitamin D deficiency, unspecified: Secondary | ICD-10-CM | POA: Diagnosis not present

## 2021-02-01 ENCOUNTER — Ambulatory Visit (INDEPENDENT_AMBULATORY_CARE_PROVIDER_SITE_OTHER): Payer: BC Managed Care – PPO | Admitting: Internal Medicine

## 2021-02-01 ENCOUNTER — Encounter: Payer: Self-pay | Admitting: Internal Medicine

## 2021-02-01 VITALS — BP 110/70 | HR 60 | Ht 63.0 in | Wt 130.6 lb

## 2021-02-01 DIAGNOSIS — Z1339 Encounter for screening examination for other mental health and behavioral disorders: Secondary | ICD-10-CM | POA: Diagnosis not present

## 2021-02-01 DIAGNOSIS — K219 Gastro-esophageal reflux disease without esophagitis: Secondary | ICD-10-CM | POA: Diagnosis not present

## 2021-02-01 DIAGNOSIS — R1310 Dysphagia, unspecified: Secondary | ICD-10-CM

## 2021-02-01 DIAGNOSIS — Z0000 Encounter for general adult medical examination without abnormal findings: Secondary | ICD-10-CM | POA: Diagnosis not present

## 2021-02-01 DIAGNOSIS — Z1211 Encounter for screening for malignant neoplasm of colon: Secondary | ICD-10-CM

## 2021-02-01 DIAGNOSIS — K5900 Constipation, unspecified: Secondary | ICD-10-CM

## 2021-02-01 DIAGNOSIS — R634 Abnormal weight loss: Secondary | ICD-10-CM

## 2021-02-01 DIAGNOSIS — Z1331 Encounter for screening for depression: Secondary | ICD-10-CM | POA: Diagnosis not present

## 2021-02-01 DIAGNOSIS — E039 Hypothyroidism, unspecified: Secondary | ICD-10-CM | POA: Diagnosis not present

## 2021-02-01 NOTE — Progress Notes (Addendum)
Chief Complaint: GERD, dysphagia, colon cancer screening  HPI : 61 year old female with history of GERD, hypothyroidism, fibromyalgia presents with colon cancer screening and dysphagia  Endorses occasional dysphagia to solids. Denies dysphagia to liquids. Her last EGD was in 2013 and showed esophageal erosions. Last colonoscopy was a few years ago and was reportedly normal, but she was told by her PCP that since she has been having constipation issues, she would benefit from a repeat colonoscopy. She is on average having a BM twice a week. Denies blood in stools. She takes PPI QD in the evening before bedtime. She wakes up with reflux in the morning. Denies fam hx of colon cancer. Her sister had Barrett's esophagus. Her mother has scleroderma in the esophagus. Endorses chest burning and regurgitation. Every morning she has to tickle the back of her throat to belch. Denies N&V. She has lost 6 lbs in the last 3 weeks, which she attributes to being busy during the holidays  Wt Readings from Last 3 Encounters:  02/01/21 130 lb 9.6 oz (59.2 kg)  02/25/15 133 lb 9.6 oz (60.6 kg)  01/25/14 137 lb (62.1 kg)   Past Medical History:  Diagnosis Date   Aortic atherosclerosis (HCC)    Arthritis    Fibromyalgia    no problems in 5 years   GERD (gastroesophageal reflux disease)    Hyperthyroidism    Raynaud's phenomenon    Thyroid disease      Past Surgical History:  Procedure Laterality Date   BREAST SURGERY     Family History  Problem Relation Age of Onset   Heart disease Mother    CAD Father    Hyperlipidemia Father    Lung cancer Father    HIV/AIDS Brother    Diabetes Maternal Grandfather    Liver cancer Maternal Grandfather    Diabetes Paternal Grandmother    HIV/AIDS Maternal Uncle    Colon cancer Maternal Uncle    Breast cancer Paternal Aunt    Social History   Tobacco Use   Smoking status: Every Day    Packs/day: 0.50    Types: Cigarettes   Smokeless tobacco: Never   Vaping Use   Vaping Use: Never used  Substance Use Topics   Alcohol use: Yes    Comment: rarely   Drug use: Never   Current Outpatient Medications  Medication Sig Dispense Refill   Ascorbic Acid (VITAMIN C ADULT GUMMIES PO) Take by mouth.     calcium carbonate (TUMS EX) 750 MG chewable tablet Chew 1 tablet by mouth daily.     cholecalciferol (VITAMIN D3) 25 MCG (1000 UNIT) tablet Take 1,000 Units by mouth daily.     ibuprofen (ADVIL,MOTRIN) 800 MG tablet Take 800 mg by mouth every 8 (eight) hours as needed.     levothyroxine (SYNTHROID) 50 MCG tablet Take 50 mcg by mouth daily before breakfast.     LORazepam (ATIVAN) 0.5 MG tablet Take 0.5 mg by mouth 2 (two) times daily.     Magnesium Oxide (QC MAGNESIUM PO) Take by mouth.     Multiple Vitamins-Minerals (ZINC PO) Take by mouth.     pantoprazole (PROTONIX) 40 MG tablet Take 40 mg by mouth daily.     No current facility-administered medications for this visit.   Allergies  Allergen Reactions   Chantix [Varenicline] Other (See Comments)    Hair loss    Ciprofloxacin    Cymbalta [Duloxetine Hcl] Other (See Comments)    Memory issue   Flexeril [  Cyclobenzaprine] Photosensitivity   Keflex [Cephalexin]    Lexapro [Escitalopram]     Slows down thinking   Lipitor [Atorvastatin]     myalgia   Lunesta [Eszopiclone]     Bad taste    Lyrica [Pregabalin]     Memory issues   Nexium [Esomeprazole]     Hair loss and weight gain   Penicillins    Tramadol     Brain Fog      Review of Systems: All systems reviewed and negative except where noted in HPI.   Physical Exam: BP 110/70    Pulse 60    Ht 5\' 3"  (1.6 m)    Wt 130 lb 9.6 oz (59.2 kg)    BMI 23.13 kg/m  Constitutional: Pleasant,well-developed, female in no acute distress. HEENT: Normocephalic and atraumatic. Conjunctivae are normal. No scleral icterus. Cardiovascular: Normal rate, regular rhythm.  Pulmonary/chest: Effort normal and breath sounds normal. No wheezing,  rales or rhonchi. Abdominal: Soft, nondistended, nontender. Bowel sounds active throughout. There are no masses palpable. No hepatomegaly. Extremities: No edema Neurological: Alert and oriented to person place and time. Skin: Skin is warm and dry. No rashes noted. Psychiatric: Normal mood and affect. Behavior is normal.  ASSESSMENT AND PLAN: Dysphagia GERD Fam hx of Barrett's esophagus Constipation Weight loss Colon cancer screening Patient presents with dysphagia and GERD.  She has family history of Barrett's esophagus and her last EGD showed signs of esophageal erosions.  It is reasonable to proceed with EGD for further evaluation.  Patient has also been having issues with constipation and weight loss, will plan for colonoscopy for further evaluation as well - Start daily Miralax - Continue Protonix QD, take 30 min prior to eating - Obtain records most recent EGD and colon from Dr. Earlean Shawl - EGD/colon [facility]  Christia Reading, MD  ADDENDUM: Received colonoscopy records from Dr. Earlean Shawl.  Colonoscopy 06/08/15: Excellent prep. Mild sigmoid diverticulosis. Recommend repeat colon in 10 years

## 2021-02-01 NOTE — Patient Instructions (Addendum)
You have been scheduled for an endoscopy and colonoscopy. Please follow the written instructions given to you at your visit today. Please pick up your prep supplies at the pharmacy within the next 1-3 days. If you use inhalers (even only as needed), please bring them with you on the day of your procedure.  Take Miralax daily.  Take your pantoprazole daily 30 minutes prior to supper.  We are going to request your records from Dr Kinnie Scales.  I appreciate the opportunity to care for you. Norwood Levo, MD

## 2021-02-28 DIAGNOSIS — J3089 Other allergic rhinitis: Secondary | ICD-10-CM | POA: Diagnosis not present

## 2021-02-28 DIAGNOSIS — H6982 Other specified disorders of Eustachian tube, left ear: Secondary | ICD-10-CM | POA: Diagnosis not present

## 2021-03-03 ENCOUNTER — Encounter: Payer: Self-pay | Admitting: Internal Medicine

## 2021-03-08 ENCOUNTER — Encounter: Payer: Self-pay | Admitting: Internal Medicine

## 2021-03-08 ENCOUNTER — Ambulatory Visit (AMBULATORY_SURGERY_CENTER): Payer: BC Managed Care – PPO | Admitting: Internal Medicine

## 2021-03-08 VITALS — BP 105/63 | HR 48 | Temp 96.2°F | Resp 14 | Ht 63.0 in | Wt 130.0 lb

## 2021-03-08 DIAGNOSIS — Z1211 Encounter for screening for malignant neoplasm of colon: Secondary | ICD-10-CM | POA: Diagnosis not present

## 2021-03-08 DIAGNOSIS — R1310 Dysphagia, unspecified: Secondary | ICD-10-CM | POA: Diagnosis not present

## 2021-03-08 DIAGNOSIS — K219 Gastro-esophageal reflux disease without esophagitis: Secondary | ICD-10-CM | POA: Diagnosis not present

## 2021-03-08 DIAGNOSIS — K2270 Barrett's esophagus without dysplasia: Secondary | ICD-10-CM

## 2021-03-08 DIAGNOSIS — K5900 Constipation, unspecified: Secondary | ICD-10-CM

## 2021-03-08 DIAGNOSIS — K317 Polyp of stomach and duodenum: Secondary | ICD-10-CM | POA: Diagnosis not present

## 2021-03-08 DIAGNOSIS — K2289 Other specified disease of esophagus: Secondary | ICD-10-CM | POA: Diagnosis not present

## 2021-03-08 DIAGNOSIS — K3189 Other diseases of stomach and duodenum: Secondary | ICD-10-CM | POA: Diagnosis not present

## 2021-03-08 MED ORDER — PANTOPRAZOLE SODIUM 40 MG PO TBEC: 40.0000 mg | DELAYED_RELEASE_TABLET | Freq: Two times a day (BID) | ORAL | 11 refills | Status: AC

## 2021-03-08 MED ORDER — SODIUM CHLORIDE 0.9 % IV SOLN: 500.0000 mL | Freq: Once | INTRAVENOUS | Status: DC

## 2021-03-08 NOTE — Progress Notes (Signed)
Pt's states no medical or surgical changes since previsit or office visit.  ° °VS DT °

## 2021-03-08 NOTE — Progress Notes (Signed)
Called to room to assist during endoscopic procedure.  Patient ID and intended procedure confirmed with present staff. Received instructions for my participation in the procedure from the performing physician.  

## 2021-03-08 NOTE — Progress Notes (Signed)
Sedate, gd SR, tolerated procedure well, VSS, report to RN 

## 2021-03-08 NOTE — Progress Notes (Signed)
GASTROENTEROLOGY PROCEDURE H&P NOTE   Primary Care Physician: Rodrigo Ran, MD    Reason for Procedure:   Dysphagia, family history of Barrett's esophagus, constipation, weight loss, constipation  Plan:    EGD/colonoscopy  Patient is appropriate for endoscopic procedure(s) in the ambulatory ([facility]) setting.  The nature of the procedure, as well as the risks, benefits, and alternatives were carefully and thoroughly reviewed with the patient. Ample time for discussion and questions allowed. The patient understood, was satisfied, and agreed to proceed.     HPI: Jennifer Parrish is a 61 y.o. female who presents for EGD/colonoscopy for evaluation of dysphagia, family history of Barrett's esophagus, constipation, weight loss .  Patient was most recently seen in the Gastroenterology Clinic on 02/01/21.  No interval change in medical history since that appointment. Please refer to that note for full details regarding GI history and clinical presentation.   Past Medical History:  Diagnosis Date   Aortic atherosclerosis (HCC)    Arthritis    Fibromyalgia    no problems in 5 years   GERD (gastroesophageal reflux disease)    Hyperthyroidism    Raynaud's phenomenon    Thyroid disease     Past Surgical History:  Procedure Laterality Date   BREAST SURGERY      Prior to Admission medications   Medication Sig Start Date End Date Taking? Authorizing Provider  Ascorbic Acid (VITAMIN C ADULT GUMMIES PO) Take by mouth.   Yes [provider]  calcium carbonate (TUMS EX) 750 MG chewable tablet Chew 1 tablet by mouth daily.   Yes [provider]  cholecalciferol (VITAMIN D3) 25 MCG (1000 UNIT) tablet Take 1,000 Units by mouth daily.   Yes [provider]  ibuprofen (ADVIL,MOTRIN) 800 MG tablet Take 800 mg by mouth every 8 (eight) hours as needed.   Yes [provider]  levothyroxine (SYNTHROID) 50 MCG tablet Take 50 mcg by mouth daily before breakfast.   Yes  [provider]  pantoprazole (PROTONIX) 40 MG tablet Take 40 mg by mouth daily.   Yes [provider]  LORazepam (ATIVAN) 0.5 MG tablet Take 0.5 mg by mouth 2 (two) times daily.    [provider]  Magnesium Oxide (QC MAGNESIUM PO) Take by mouth.    [provider]  Multiple Vitamins-Minerals (ZINC PO) Take by mouth.    [provider]    Current Outpatient Medications  Medication Sig Dispense Refill   Ascorbic Acid (VITAMIN C ADULT GUMMIES PO) Take by mouth.     calcium carbonate (TUMS EX) 750 MG chewable tablet Chew 1 tablet by mouth daily.     cholecalciferol (VITAMIN D3) 25 MCG (1000 UNIT) tablet Take 1,000 Units by mouth daily.     ibuprofen (ADVIL,MOTRIN) 800 MG tablet Take 800 mg by mouth every 8 (eight) hours as needed.     levothyroxine (SYNTHROID) 50 MCG tablet Take 50 mcg by mouth daily before breakfast.     pantoprazole (PROTONIX) 40 MG tablet Take 40 mg by mouth daily.     LORazepam (ATIVAN) 0.5 MG tablet Take 0.5 mg by mouth 2 (two) times daily.     Magnesium Oxide (QC MAGNESIUM PO) Take by mouth.     Multiple Vitamins-Minerals (ZINC PO) Take by mouth.     Current Facility-Administered Medications  Medication Dose Route Frequency Provider Last Rate Last Admin   0.9 %  sodium chloride infusion  500 mL Intravenous Once Imogene Burn, MD  Allergies as of 03/08/2021 - Review Complete 03/08/2021  Allergen Reaction Noted   Chantix [varenicline] Other (See Comments) 01/10/2020   Ciprofloxacin  01/25/2014   Cymbalta [duloxetine hcl] Other (See Comments) 01/10/2020   Flexeril [cyclobenzaprine] Photosensitivity 01/10/2020   Keflex [cephalexin]  01/25/2014   Lexapro [escitalopram]  01/10/2020   Lipitor [atorvastatin]  01/10/2020   Lunesta [eszopiclone]  01/10/2020   Lyrica [pregabalin]  01/10/2020   Nexium [esomeprazole]  01/10/2020   Penicillins  01/25/2014   Tramadol  01/10/2020    Family History  Problem Relation Age  of Onset   Heart disease Mother    CAD Father    Hyperlipidemia Father    Lung cancer Father    HIV/AIDS Brother    Diabetes Maternal Grandfather    Liver cancer Maternal Grandfather    Diabetes Paternal Grandmother    HIV/AIDS Maternal Uncle    Colon cancer Maternal Uncle    Breast cancer Paternal Aunt     Social History   Socioeconomic History   Marital status: Married    Spouse name: Not on file   Number of children: Not on file   Years of education: Not on file   Highest education level: Not on file  Occupational History   Not on file  Tobacco Use   Smoking status: Every Day    Packs/day: 0.50    Types: Cigarettes   Smokeless tobacco: Never  Vaping Use   Vaping Use: Never used  Substance and Sexual Activity   Alcohol use: Yes    Comment: rarely   Drug use: Never   Sexual activity: Not on file  Other Topics Concern   Not on file  Social History Narrative   Not on file   Social Determinants of Health   Financial Resource Strain: Not on file  Food Insecurity: Not on file  Transportation Needs: Not on file  Physical Activity: Not on file  Stress: Not on file  Social Connections: Not on file  Intimate Partner Violence: Not on file    Physical Exam: Vital signs in last 24 hours: BP 115/74    Pulse 65    Temp (!) 96.2 F (35.7 C) (Temporal)    Ht 5\' 3"  (1.6 m)    Wt 130 lb (59 kg)    SpO2 100%    BMI 23.03 kg/m  GEN: NAD EYE: Sclerae anicteric ENT: MMM CV: Non-tachycardic Pulm: No increased WOB GI: Soft NEURO:  Alert & Oriented   , MD [facility]   03/08/2021 1:56 PM

## 2021-03-08 NOTE — Op Note (Addendum)
[facility] Patient Name: [name] Procedure Date: 03/08/2021 1:49 PM MRN: [id number] Endoscopist: Nicole Kindred "Jennifer Parrish ,  Age: 61 Referring MD:  Date of Birth: 07-18-1960 Gender: Female Account #: [id number] Procedure:                Colonoscopy Indications:              Screening for colorectal malignant neoplasm Medicines:                Monitored Anesthesia Care Procedure:                Pre-Anesthesia Assessment:                           - Prior to the procedure, a History and Physical                            was performed, and patient medications and                            allergies were reviewed. The patient's tolerance of                            previous anesthesia was also reviewed. The risks                            and benefits of the procedure and the sedation                            options and risks were discussed with the patient.                            All questions were answered, and informed consent                            was obtained. Prior Anticoagulants: The patient has                            taken no previous anticoagulant or antiplatelet                            agents. ASA Grade Assessment: II - A patient with                            mild systemic disease. After reviewing the risks                            and benefits, the patient was deemed in                            satisfactory condition to undergo the procedure.                           After obtaining informed consent, the colonoscope  was passed under direct vision. Throughout the                            procedure, the patient's blood pressure, pulse, and                            oxygen saturations were monitored continuously. The                            Olympus PCF-[id number]) Colonoscope was                            introduced through the anus and advanced to the the                            cecum,  identified by appendiceal orifice and                            ileocecal valve. The colonoscopy was performed                            without difficulty. The patient tolerated the                            procedure well. The quality of the bowel                            preparation was adequate. The terminal ileum,                            ileocecal valve, appendiceal orifice, and rectum                            were photographed. Scope In: 2:23:00 PM Scope Out: 2:41:45 PM Scope Withdrawal Time: 0 hours 14 minutes 27 seconds  Total Procedure Duration: 0 hours 18 minutes 45 seconds  Findings:                 Non-bleeding internal hemorrhoids were found during                            retroflexion.                           The exam was otherwise without abnormality. Complications:            No immediate complications. Estimated Blood Loss:     Estimated blood loss was minimal. Impression:               - Non-bleeding internal hemorrhoids.                           - The examination was otherwise normal.                           - No specimens collected. Recommendation:           -  Discharge patient to home (with escort).                           - Repeat colonoscopy in 10 years for screening                            purposes.                           - Return to GI clinic in 2 months.                           - The findings and recommendations were discussed                            with the patient. [address]Jennifer Parrish,  03/08/2021 2:55:58 PM

## 2021-03-08 NOTE — Op Note (Signed)
[facility] Patient Name: Jennifer Parrish Procedure Date: 03/08/2021 1:58 PM MRN: [id number] Endoscopist: Nicole Kindred "Jennifer Parrish ,  Age: 61 Referring MD:  Date of Birth: 10-Oct-1960 Gender: Female Account #: [id number] Procedure:                Upper GI endoscopy Indications:              Dysphagia, Heartburn Medicines:                Monitored Anesthesia Care Procedure:                Pre-Anesthesia Assessment:                           - Prior to the procedure, a History and Physical                            was performed, and patient medications and                            allergies were reviewed. The patient's tolerance of                            previous anesthesia was also reviewed. The risks                            and benefits of the procedure and the sedation                            options and risks were discussed with the patient.                            All questions were answered, and informed consent                            was obtained. Prior Anticoagulants: The patient has                            taken no previous anticoagulant or antiplatelet                            agents. ASA Grade Assessment: II - A patient with                            mild systemic disease. After reviewing the risks                            and benefits, the patient was deemed in                            satisfactory condition to undergo the procedure.                           After obtaining informed consent, the endoscope was  passed under direct vision. Throughout the                            procedure, the patient's blood pressure, pulse, and                            oxygen saturations were monitored continuously. The                            Endoscope was introduced through the mouth, and                            advanced to the second part of duodenum. The upper                            GI endoscopy was accomplished  without difficulty.                            The patient tolerated the procedure well. Scope In: Scope Out: Findings:                 Biopsies were taken with a cold forceps in the                            proximal esophagus and in the distal esophagus for                            histology.                           Salmon-colored mucosa was present. The maximum                            longitudinal extent of these esophageal mucosal                            changes was 2 cm in length. Biopsies were taken                            with a cold forceps for histology.                           A hiatal hernia was present.                           Localized erythematous mucosa without bleeding was                            found in the gastric antrum. Biopsies were taken                            with a cold forceps for histology.                           A single 5 mm sessile polyp  with no bleeding and no                            stigmata of recent bleeding was found in the                            gastric antrum. Biopsies were taken with a cold                            forceps for histology.                           The examined duodenum was normal. Biopsies were                            taken with a cold forceps for histology. Complications:            No immediate complications. Estimated Blood Loss:     Estimated blood loss was minimal. Impression:               - Biopsies were taken with a cold forceps for                            histology in the proximal esophagus and in the                            distal esophagus.                           - Salmon-colored mucosa suspicious for                            short-segment Barrett's esophagus. Biopsied.                           - Hiatal hernia.                           - Erythematous mucosa in the antrum. Biopsied.                           - A single gastric polyp. Biopsied.                           -  Normal examined duodenum. Biopsied. Recommendation:           - Await pathology results.                           - Increase pantoprazole 40 mg QD to twice daily to                            see if this will help further with your symptoms.                           - Perform a colonoscopy today. Nicole Kindred "Jennifer Parrish,  03/08/2021 2:53:40 PM

## 2021-03-08 NOTE — Patient Instructions (Signed)
Please read handouts provided. Await pathology results. Increase pantoprazole to 40 mg twice daily. Repeat colonoscopy in 10 years for screening.   YOU HAD AN ENDOSCOPIC PROCEDURE TODAY AT THE [facility]:   Refer to the procedure report that was given to you for any specific questions about what was found during the examination.  If the procedure report does not answer your questions, please call your gastroenterologist to clarify.  If you requested that your care partner not be given the details of your procedure findings, then the procedure report has been included in a sealed envelope for you to review at your convenience later.  YOU SHOULD EXPECT: Some feelings of bloating in the abdomen. Passage of more gas than usual.  Walking can help get rid of the air that was put into your GI tract during the procedure and reduce the bloating. If you had a lower endoscopy (such as a colonoscopy or flexible sigmoidoscopy) you may notice spotting of blood in your stool or on the toilet paper. If you underwent a bowel prep for your procedure, you may not have a normal bowel movement for a few days.  Please Note:  You might notice some irritation and congestion in your nose or some drainage.  This is from the oxygen used during your procedure.  There is no need for concern and it should clear up in a day or so.  SYMPTOMS TO REPORT IMMEDIATELY:  Following lower endoscopy (colonoscopy or flexible sigmoidoscopy):  Excessive amounts of blood in the stool  Significant tenderness or worsening of abdominal pains  Swelling of the abdomen that is new, acute  Fever of 100F or higher  Following upper endoscopy (EGD)  Vomiting of blood or coffee ground material  New chest pain or pain under the shoulder blades  Painful or persistently difficult swallowing  New shortness of breath  Fever of 100F or higher  Black, tarry-looking stools  For urgent or emergent issues, a gastroenterologist can be  reached at any hour by calling [phone]. Do not use MyChart messaging for urgent concerns.    DIET:  We do recommend a small meal at first, but then you may proceed to your regular diet.  Drink plenty of fluids but you should avoid alcoholic beverages for 24 hours.  ACTIVITY:  You should plan to take it easy for the rest of today and you should NOT DRIVE or use heavy machinery until tomorrow (because of the sedation medicines used during the test).    FOLLOW UP: Our staff will call the number listed on your records 48-72 hours following your procedure to check on you and address any questions or concerns that you may have regarding the information given to you following your procedure. If we do not reach you, we will leave a message.  We will attempt to reach you two times.  During this call, we will ask if you have developed any symptoms of COVID 19. If you develop any symptoms (ie: fever, flu-like symptoms, shortness of breath, cough etc.) before then, please call [phone].  If you test positive for Covid 19 in the 2 weeks post procedure, please call and report this information to [place].    If any biopsies were taken you will be contacted by phone or by letter within the next 1-3 weeks.  Please call us at [phone] if you have not heard about the biopsies in 3 weeks.    SIGNATURES/CONFIDENTIALITY: You and/or your care partner have signed paperwork which  will be entered into your electronic medical record.  These signatures attest to the fact that that the information above on your After Visit Summary has been reviewed and is understood.  Full responsibility of the confidentiality of this discharge information lies with you and/or your care-partner.

## 2021-03-10 ENCOUNTER — Telehealth: Payer: Self-pay

## 2021-03-10 DIAGNOSIS — F4323 Adjustment disorder with mixed anxiety and depressed mood: Secondary | ICD-10-CM | POA: Diagnosis not present

## 2021-03-10 NOTE — Telephone Encounter (Signed)
°  Follow up Call-  Call back number 03/08/2021  Post procedure Call Back phone  # [phone]  Permission to leave phone message Yes  Some recent data might be hidden     Patient questions:  Do you have a fever, pain , or abdominal swelling? No. Pain Score  0 *  Have you tolerated food without any problems? Yes.    Have you been able to return to your normal activities? Yes.    Do you have any questions about your discharge instructions: Diet   No. Medications  No. Follow up visit  No.  Do you have questions or concerns about your Care? No.  Actions: * If pain score is 4 or above: No action needed, pain <4.  Have you developed a fever since your procedure? no  2.   Have you had an respiratory symptoms (SOB or cough) since your procedure? no  3.   Have you tested positive for COVID 19 since your procedure no  4.   Have you had any family members/close contacts diagnosed with the COVID 19 since your procedure?  no   If yes to any of these questions please route to Laverna Peace, RN and Karlton Lemon, RN

## 2021-03-12 ENCOUNTER — Encounter: Payer: Self-pay | Admitting: Internal Medicine

## 2021-03-15 ENCOUNTER — Encounter: Payer: Self-pay | Admitting: Internal Medicine

## 2021-03-30 DIAGNOSIS — F4323 Adjustment disorder with mixed anxiety and depressed mood: Secondary | ICD-10-CM | POA: Diagnosis not present

## 2021-04-21 DIAGNOSIS — F4323 Adjustment disorder with mixed anxiety and depressed mood: Secondary | ICD-10-CM | POA: Diagnosis not present

## 2021-04-29 DIAGNOSIS — F4323 Adjustment disorder with mixed anxiety and depressed mood: Secondary | ICD-10-CM | POA: Diagnosis not present

## 2021-05-25 DIAGNOSIS — F4323 Adjustment disorder with mixed anxiety and depressed mood: Secondary | ICD-10-CM | POA: Diagnosis not present

## 2021-06-07 DIAGNOSIS — E039 Hypothyroidism, unspecified: Secondary | ICD-10-CM | POA: Diagnosis not present

## 2021-06-07 DIAGNOSIS — M797 Fibromyalgia: Secondary | ICD-10-CM | POA: Diagnosis not present

## 2021-06-07 DIAGNOSIS — K2270 Barrett's esophagus without dysplasia: Secondary | ICD-10-CM | POA: Diagnosis not present

## 2021-06-08 DIAGNOSIS — F4323 Adjustment disorder with mixed anxiety and depressed mood: Secondary | ICD-10-CM | POA: Diagnosis not present

## 2021-06-22 DIAGNOSIS — F4323 Adjustment disorder with mixed anxiety and depressed mood: Secondary | ICD-10-CM | POA: Diagnosis not present

## 2021-12-13 DIAGNOSIS — R0982 Postnasal drip: Secondary | ICD-10-CM | POA: Diagnosis not present

## 2021-12-13 DIAGNOSIS — J069 Acute upper respiratory infection, unspecified: Secondary | ICD-10-CM | POA: Diagnosis not present

## 2021-12-13 DIAGNOSIS — U071 COVID-19: Secondary | ICD-10-CM | POA: Diagnosis not present

## 2021-12-13 DIAGNOSIS — R051 Acute cough: Secondary | ICD-10-CM | POA: Diagnosis not present

## 2021-12-13 DIAGNOSIS — R52 Pain, unspecified: Secondary | ICD-10-CM | POA: Diagnosis not present

## 2022-01-14 ENCOUNTER — Emergency Department (HOSPITAL_BASED_OUTPATIENT_CLINIC_OR_DEPARTMENT_OTHER)
Admission: EM | Admit: 2022-01-14 | Discharge: 2022-01-14 | Disposition: A | Discharge status: Home / Self Care | Payer: 59 | Attending: Emergency Medicine | Admitting: Emergency Medicine

## 2022-01-14 ENCOUNTER — Emergency Department (HOSPITAL_BASED_OUTPATIENT_CLINIC_OR_DEPARTMENT_OTHER): Payer: 59

## 2022-01-14 DIAGNOSIS — M25552 Pain in left hip: Secondary | ICD-10-CM | POA: Insufficient documentation

## 2022-01-14 DIAGNOSIS — M542 Cervicalgia: Secondary | ICD-10-CM | POA: Insufficient documentation

## 2022-01-14 DIAGNOSIS — M545 Low back pain, unspecified: Secondary | ICD-10-CM | POA: Diagnosis not present

## 2022-01-14 DIAGNOSIS — M25512 Pain in left shoulder: Secondary | ICD-10-CM | POA: Insufficient documentation

## 2022-01-14 DIAGNOSIS — Y92410 Unspecified street and highway as the place of occurrence of the external cause: Secondary | ICD-10-CM | POA: Diagnosis not present

## 2022-01-14 DIAGNOSIS — M25532 Pain in left wrist: Secondary | ICD-10-CM | POA: Diagnosis not present

## 2022-01-14 DIAGNOSIS — R519 Headache, unspecified: Secondary | ICD-10-CM | POA: Diagnosis not present

## 2022-01-14 MED ORDER — METHOCARBAMOL 500 MG PO TABS: 500.0000 mg | ORAL_TABLET | Freq: Three times a day (TID) | ORAL | 0 refills | Status: DC | PRN
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
## 2022-01-14 NOTE — Discharge Instructions (Signed)
Return to the emergency department immediately if you develop any of the following symptoms: You have numbness, tingling, or weakness in the arms or legs. You develop severe headaches not relieved with medicine. You have severe neck pain, especially tenderness in the middle of the back of your neck. You have changes in bowel or bladder control. There is increasing pain in any area of the body. You have shortness of breath, light-headedness, dizziness, or fainting. You have chest pain. You feel sick to your stomach (nauseous), throw up (vomit), or sweat. You have increasing abdominal discomfort. There is blood in your urine, stool, or vomit. You have pain in your shoulder (shoulder strap areas). You feel your symptoms are getting worse.  Also- Contact a health care provider if: Your symptoms do not improve or get worse. You have new symptoms. You have another injury. Your coordination gets worse. You have unusual behavior changes. Get help right away if: You have a severe or worsening headache. You have weakness or numbness in any part of your body, slurred speech, vision changes, or confusion. You vomit repeatedly. You lose consciousness, are extremely sleepy, or are difficult to arouse. You have a seizure. These symptoms may be an emergency. Get help right away. Call 911. Do not wait to see if the symptoms will go away. Do not drive yourself to the hospital.

## 2022-01-31 DIAGNOSIS — M79641 Pain in right hand: Secondary | ICD-10-CM | POA: Diagnosis not present

## 2022-01-31 DIAGNOSIS — M13832 Other specified arthritis, left wrist: Secondary | ICD-10-CM | POA: Diagnosis not present

## 2022-01-31 DIAGNOSIS — R52 Pain, unspecified: Secondary | ICD-10-CM | POA: Diagnosis not present

## 2022-01-31 DIAGNOSIS — M13841 Other specified arthritis, right hand: Secondary | ICD-10-CM | POA: Diagnosis not present

## 2022-01-31 DIAGNOSIS — M13831 Other specified arthritis, right wrist: Secondary | ICD-10-CM | POA: Diagnosis not present

## 2022-02-09 DIAGNOSIS — M47816 Spondylosis without myelopathy or radiculopathy, lumbar region: Secondary | ICD-10-CM | POA: Diagnosis not present

## 2022-02-09 DIAGNOSIS — M542 Cervicalgia: Secondary | ICD-10-CM | POA: Diagnosis not present

## 2022-02-09 DIAGNOSIS — M47812 Spondylosis without myelopathy or radiculopathy, cervical region: Secondary | ICD-10-CM | POA: Diagnosis not present

## 2022-02-09 DIAGNOSIS — M5451 Vertebrogenic low back pain: Secondary | ICD-10-CM | POA: Diagnosis not present

## 2022-02-09 DIAGNOSIS — M47896 Other spondylosis, lumbar region: Secondary | ICD-10-CM | POA: Diagnosis not present

## 2022-02-14 DIAGNOSIS — I700 Atherosclerosis of aorta: Secondary | ICD-10-CM | POA: Diagnosis not present

## 2022-02-14 DIAGNOSIS — E039 Hypothyroidism, unspecified: Secondary | ICD-10-CM | POA: Diagnosis not present

## 2022-02-14 DIAGNOSIS — E559 Vitamin D deficiency, unspecified: Secondary | ICD-10-CM | POA: Diagnosis not present

## 2022-02-14 DIAGNOSIS — E785 Hyperlipidemia, unspecified: Secondary | ICD-10-CM | POA: Diagnosis not present

## 2022-02-14 DIAGNOSIS — R7989 Other specified abnormal findings of blood chemistry: Secondary | ICD-10-CM | POA: Diagnosis not present

## 2022-02-21 DIAGNOSIS — Q839 Congenital malformation of breast, unspecified: Secondary | ICD-10-CM | POA: Diagnosis not present

## 2022-02-21 DIAGNOSIS — Z780 Asymptomatic menopausal state: Secondary | ICD-10-CM | POA: Diagnosis not present

## 2022-02-21 DIAGNOSIS — M8580 Other specified disorders of bone density and structure, unspecified site: Secondary | ICD-10-CM | POA: Diagnosis not present

## 2022-02-21 DIAGNOSIS — E785 Hyperlipidemia, unspecified: Secondary | ICD-10-CM | POA: Diagnosis not present

## 2022-02-21 DIAGNOSIS — Z0000 Encounter for general adult medical examination without abnormal findings: Secondary | ICD-10-CM | POA: Diagnosis not present

## 2022-02-21 DIAGNOSIS — J309 Allergic rhinitis, unspecified: Secondary | ICD-10-CM | POA: Diagnosis not present

## 2022-02-21 DIAGNOSIS — K2270 Barrett's esophagus without dysplasia: Secondary | ICD-10-CM | POA: Diagnosis not present

## 2022-02-21 DIAGNOSIS — R6889 Other general symptoms and signs: Secondary | ICD-10-CM | POA: Diagnosis not present

## 2022-02-21 DIAGNOSIS — I700 Atherosclerosis of aorta: Secondary | ICD-10-CM | POA: Diagnosis not present

## 2022-02-21 DIAGNOSIS — Z1331 Encounter for screening for depression: Secondary | ICD-10-CM | POA: Diagnosis not present

## 2022-02-21 DIAGNOSIS — I7300 Raynaud's syndrome without gangrene: Secondary | ICD-10-CM | POA: Diagnosis not present

## 2022-02-21 DIAGNOSIS — F329 Major depressive disorder, single episode, unspecified: Secondary | ICD-10-CM | POA: Diagnosis not present

## 2022-02-21 DIAGNOSIS — R69 Illness, unspecified: Secondary | ICD-10-CM | POA: Diagnosis not present

## 2022-06-10 DIAGNOSIS — J302 Other seasonal allergic rhinitis: Secondary | ICD-10-CM | POA: Diagnosis not present

## 2022-06-10 DIAGNOSIS — E039 Hypothyroidism, unspecified: Secondary | ICD-10-CM | POA: Diagnosis not present

## 2022-06-10 DIAGNOSIS — Z720 Tobacco use: Secondary | ICD-10-CM | POA: Diagnosis not present

## 2022-06-10 DIAGNOSIS — M1990 Unspecified osteoarthritis, unspecified site: Secondary | ICD-10-CM | POA: Diagnosis not present

## 2022-06-10 DIAGNOSIS — K219 Gastro-esophageal reflux disease without esophagitis: Secondary | ICD-10-CM | POA: Diagnosis not present

## 2022-06-10 DIAGNOSIS — F4322 Adjustment disorder with anxiety: Secondary | ICD-10-CM | POA: Diagnosis not present

## 2022-06-10 DIAGNOSIS — I700 Atherosclerosis of aorta: Secondary | ICD-10-CM | POA: Diagnosis not present

## 2022-06-10 DIAGNOSIS — Z8249 Family history of ischemic heart disease and other diseases of the circulatory system: Secondary | ICD-10-CM | POA: Diagnosis not present

## 2022-06-10 DIAGNOSIS — Z791 Long term (current) use of non-steroidal anti-inflammatories (NSAID): Secondary | ICD-10-CM | POA: Diagnosis not present

## 2022-06-10 DIAGNOSIS — F32A Depression, unspecified: Secondary | ICD-10-CM | POA: Diagnosis not present

## 2022-06-10 DIAGNOSIS — Z803 Family history of malignant neoplasm of breast: Secondary | ICD-10-CM | POA: Diagnosis not present

## 2022-06-10 DIAGNOSIS — M810 Age-related osteoporosis without current pathological fracture: Secondary | ICD-10-CM | POA: Diagnosis not present

## 2022-11-22 DIAGNOSIS — M47812 Spondylosis without myelopathy or radiculopathy, cervical region: Secondary | ICD-10-CM | POA: Diagnosis not present

## 2022-11-22 DIAGNOSIS — M5451 Vertebrogenic low back pain: Secondary | ICD-10-CM | POA: Diagnosis not present

## 2022-11-22 DIAGNOSIS — M47816 Spondylosis without myelopathy or radiculopathy, lumbar region: Secondary | ICD-10-CM | POA: Diagnosis not present

## 2022-12-03 DIAGNOSIS — J019 Acute sinusitis, unspecified: Secondary | ICD-10-CM | POA: Diagnosis not present

## 2022-12-03 DIAGNOSIS — Z299 Encounter for prophylactic measures, unspecified: Secondary | ICD-10-CM | POA: Diagnosis not present

## 2022-12-12 DIAGNOSIS — M5451 Vertebrogenic low back pain: Secondary | ICD-10-CM | POA: Diagnosis not present

## 2022-12-12 DIAGNOSIS — M542 Cervicalgia: Secondary | ICD-10-CM | POA: Diagnosis not present

## 2022-12-14 DIAGNOSIS — M542 Cervicalgia: Secondary | ICD-10-CM | POA: Diagnosis not present

## 2022-12-14 DIAGNOSIS — M5451 Vertebrogenic low back pain: Secondary | ICD-10-CM | POA: Diagnosis not present

## 2022-12-19 DIAGNOSIS — M47816 Spondylosis without myelopathy or radiculopathy, lumbar region: Secondary | ICD-10-CM | POA: Diagnosis not present

## 2022-12-19 DIAGNOSIS — M5451 Vertebrogenic low back pain: Secondary | ICD-10-CM | POA: Diagnosis not present

## 2022-12-19 DIAGNOSIS — M47812 Spondylosis without myelopathy or radiculopathy, cervical region: Secondary | ICD-10-CM | POA: Diagnosis not present

## 2022-12-26 DIAGNOSIS — M5451 Vertebrogenic low back pain: Secondary | ICD-10-CM | POA: Diagnosis not present

## 2022-12-26 DIAGNOSIS — M542 Cervicalgia: Secondary | ICD-10-CM | POA: Diagnosis not present

## 2023-01-06 DIAGNOSIS — M5451 Vertebrogenic low back pain: Secondary | ICD-10-CM | POA: Diagnosis not present

## 2023-01-06 DIAGNOSIS — M542 Cervicalgia: Secondary | ICD-10-CM | POA: Diagnosis not present

## 2023-01-23 DIAGNOSIS — M5451 Vertebrogenic low back pain: Secondary | ICD-10-CM | POA: Diagnosis not present

## 2023-01-23 DIAGNOSIS — M542 Cervicalgia: Secondary | ICD-10-CM | POA: Diagnosis not present

## 2023-01-27 DIAGNOSIS — M5451 Vertebrogenic low back pain: Secondary | ICD-10-CM | POA: Diagnosis not present

## 2023-01-27 DIAGNOSIS — M542 Cervicalgia: Secondary | ICD-10-CM | POA: Diagnosis not present

## 2023-03-06 ENCOUNTER — Ambulatory Visit: Payer: 59 | Admitting: Neurology

## 2023-03-06 ENCOUNTER — Encounter: Payer: Self-pay | Admitting: Neurology

## 2023-03-06 VITALS — BP 110/70 | HR 70 | Ht 63.75 in | Wt 128.0 lb

## 2023-03-06 DIAGNOSIS — S060X0D Concussion without loss of consciousness, subsequent encounter: Secondary | ICD-10-CM | POA: Diagnosis not present

## 2023-03-06 DIAGNOSIS — F0781 Postconcussional syndrome: Secondary | ICD-10-CM

## 2023-03-06 DIAGNOSIS — R4189 Other symptoms and signs involving cognitive functions and awareness: Secondary | ICD-10-CM

## 2023-03-06 DIAGNOSIS — R41840 Attention and concentration deficit: Secondary | ICD-10-CM

## 2023-03-06 MED ORDER — AMANTADINE HCL 100 MG PO CAPS
100.0000 mg | ORAL_CAPSULE | Freq: Every day | ORAL | 0 refills | Status: AC
Start: 2023-03-06 — End: ?
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
## 2023-03-06 NOTE — Patient Instructions (Signed)
 Start amantadine  100 mg daily, side effect include insomnia Continue other medication Continue follow-up with your doctor Return as needed.

## 2023-03-28 ENCOUNTER — Other Ambulatory Visit: Payer: Self-pay | Admitting: Neurology

## 2023-04-05 ENCOUNTER — Other Ambulatory Visit: Payer: Self-pay | Admitting: Neurology

## 2023-04-17 DIAGNOSIS — Z1212 Encounter for screening for malignant neoplasm of rectum: Secondary | ICD-10-CM | POA: Diagnosis not present

## 2023-04-17 DIAGNOSIS — E039 Hypothyroidism, unspecified: Secondary | ICD-10-CM | POA: Diagnosis not present

## 2023-04-17 DIAGNOSIS — E559 Vitamin D deficiency, unspecified: Secondary | ICD-10-CM | POA: Diagnosis not present

## 2023-04-17 DIAGNOSIS — E785 Hyperlipidemia, unspecified: Secondary | ICD-10-CM | POA: Diagnosis not present

## 2023-04-24 DIAGNOSIS — J309 Allergic rhinitis, unspecified: Secondary | ICD-10-CM | POA: Diagnosis not present

## 2023-04-24 DIAGNOSIS — I700 Atherosclerosis of aorta: Secondary | ICD-10-CM | POA: Diagnosis not present

## 2023-04-24 DIAGNOSIS — K219 Gastro-esophageal reflux disease without esophagitis: Secondary | ICD-10-CM | POA: Diagnosis not present

## 2023-04-24 DIAGNOSIS — F0781 Postconcussional syndrome: Secondary | ICD-10-CM | POA: Diagnosis not present

## 2023-04-24 DIAGNOSIS — Z0000 Encounter for general adult medical examination without abnormal findings: Secondary | ICD-10-CM | POA: Diagnosis not present

## 2023-04-24 DIAGNOSIS — K2270 Barrett's esophagus without dysplasia: Secondary | ICD-10-CM | POA: Diagnosis not present

## 2023-04-24 DIAGNOSIS — M797 Fibromyalgia: Secondary | ICD-10-CM | POA: Diagnosis not present

## 2023-04-24 DIAGNOSIS — I7300 Raynaud's syndrome without gangrene: Secondary | ICD-10-CM | POA: Diagnosis not present

## 2023-04-24 DIAGNOSIS — Q839 Congenital malformation of breast, unspecified: Secondary | ICD-10-CM | POA: Diagnosis not present

## 2023-04-24 DIAGNOSIS — E039 Hypothyroidism, unspecified: Secondary | ICD-10-CM | POA: Diagnosis not present

## 2023-04-24 DIAGNOSIS — Z720 Tobacco use: Secondary | ICD-10-CM | POA: Diagnosis not present

## 2023-04-24 DIAGNOSIS — R82998 Other abnormal findings in urine: Secondary | ICD-10-CM | POA: Diagnosis not present

## 2023-04-24 DIAGNOSIS — R748 Abnormal levels of other serum enzymes: Secondary | ICD-10-CM | POA: Diagnosis not present

## 2023-05-08 DIAGNOSIS — M8580 Other specified disorders of bone density and structure, unspecified site: Secondary | ICD-10-CM | POA: Diagnosis not present

## 2023-05-08 DIAGNOSIS — Z780 Asymptomatic menopausal state: Secondary | ICD-10-CM | POA: Diagnosis not present

## 2023-09-26 ENCOUNTER — Other Ambulatory Visit: Payer: Self-pay | Admitting: Gastroenterology

## 2023-09-26 DIAGNOSIS — R748 Abnormal levels of other serum enzymes: Secondary | ICD-10-CM

## 2023-10-04 ENCOUNTER — Ambulatory Visit
Admission: RE | Admit: 2023-10-04 | Discharge: 2023-10-04 | Disposition: A | Discharge status: Home / Self Care | Source: Ambulatory Visit | Attending: Gastroenterology | Admitting: Gastroenterology

## 2023-10-04 DIAGNOSIS — R748 Abnormal levels of other serum enzymes: Secondary | ICD-10-CM

## 2023-10-04 MED ORDER — GADOPICLENOL 0.5 MMOL/ML IV SOLN
6.0000 mL | Freq: Once | INTRAVENOUS | Status: AC | PRN
  Administered 2023-10-04: 6 mL via INTRAVENOUS
